# Patient Record
Sex: Male | Born: 1945
Health system: Southern US, Community
[De-identification: ages and names within clinical notes are randomized; demographics above are authoritative.]

## PROBLEM LIST (undated history)

## (undated) DIAGNOSIS — E782 Mixed hyperlipidemia: Secondary | ICD-10-CM

## (undated) DIAGNOSIS — I779 Disorder of arteries and arterioles, unspecified: Secondary | ICD-10-CM

## (undated) DIAGNOSIS — I739 Peripheral vascular disease, unspecified: Secondary | ICD-10-CM

## (undated) DIAGNOSIS — C61 Malignant neoplasm of prostate: Secondary | ICD-10-CM

## (undated) DIAGNOSIS — Z8673 Personal history of transient ischemic attack (TIA), and cerebral infarction without residual deficits: Secondary | ICD-10-CM

## (undated) DIAGNOSIS — I2510 Atherosclerotic heart disease of native coronary artery without angina pectoris: Secondary | ICD-10-CM

## (undated) DIAGNOSIS — K219 Gastro-esophageal reflux disease without esophagitis: Secondary | ICD-10-CM

## (undated) DIAGNOSIS — I639 Cerebral infarction, unspecified: Secondary | ICD-10-CM

## (undated) DIAGNOSIS — C801 Malignant (primary) neoplasm, unspecified: Secondary | ICD-10-CM

## (undated) DIAGNOSIS — I10 Essential (primary) hypertension: Secondary | ICD-10-CM

## (undated) DIAGNOSIS — E119 Type 2 diabetes mellitus without complications: Secondary | ICD-10-CM

## (undated) HISTORY — DX: Mixed hyperlipidemia: E78.2

## (undated) HISTORY — DX: Atherosclerotic heart disease of native coronary artery without angina pectoris: I25.10

## (undated) HISTORY — DX: Disorder of arteries and arterioles, unspecified: I77.9

## (undated) HISTORY — DX: Essential (primary) hypertension: I10

## (undated) HISTORY — DX: Peripheral vascular disease, unspecified: I73.9

## (undated) HISTORY — DX: Personal history of transient ischemic attack (TIA), and cerebral infarction without residual deficits: Z86.73

## (undated) HISTORY — DX: Gastro-esophageal reflux disease without esophagitis: K21.9

## (undated) HISTORY — DX: Type 2 diabetes mellitus without complications: E11.9

## (undated) HISTORY — DX: Malignant neoplasm of prostate: C61

## (undated) HISTORY — PX: TRANSTHORACIC ECHOCARDIOGRAM: SHX275

---

## 2002-07-20 ENCOUNTER — Emergency Department (HOSPITAL_COMMUNITY): Admission: EM | Admit: 2002-07-20 | Discharge: 2002-07-20 | Payer: Self-pay | Admitting: Emergency Medicine

## 2002-07-31 DIAGNOSIS — C61 Malignant neoplasm of prostate: Secondary | ICD-10-CM

## 2002-07-31 HISTORY — DX: Malignant neoplasm of prostate: C61

## 2002-07-31 HISTORY — PX: PROSTATE SURGERY: SHX751

## 2002-10-21 ENCOUNTER — Emergency Department (HOSPITAL_COMMUNITY): Admission: EM | Admit: 2002-10-21 | Discharge: 2002-10-21 | Payer: Self-pay | Admitting: Emergency Medicine

## 2002-10-22 ENCOUNTER — Other Ambulatory Visit: Admission: RE | Admit: 2002-10-22 | Discharge: 2002-10-22 | Payer: Self-pay | Admitting: Urology

## 2002-12-03 ENCOUNTER — Inpatient Hospital Stay (HOSPITAL_COMMUNITY): Admission: RE | Admit: 2002-12-03 | Discharge: 2002-12-08 | Payer: Self-pay | Admitting: Urology

## 2003-02-25 ENCOUNTER — Ambulatory Visit (HOSPITAL_COMMUNITY): Admission: RE | Admit: 2003-02-25 | Discharge: 2003-02-25 | Payer: Self-pay | Admitting: Internal Medicine

## 2003-02-25 HISTORY — PX: COLONOSCOPY: SHX174

## 2003-12-11 ENCOUNTER — Ambulatory Visit (HOSPITAL_COMMUNITY): Admission: RE | Admit: 2003-12-11 | Discharge: 2003-12-11 | Payer: Self-pay | Admitting: Family Medicine

## 2003-12-14 ENCOUNTER — Ambulatory Visit (HOSPITAL_COMMUNITY): Admission: RE | Admit: 2003-12-14 | Discharge: 2003-12-14 | Payer: Self-pay | Admitting: Family Medicine

## 2005-12-19 ENCOUNTER — Ambulatory Visit (HOSPITAL_COMMUNITY): Admission: RE | Admit: 2005-12-19 | Discharge: 2005-12-19 | Payer: Self-pay | Admitting: Internal Medicine

## 2006-02-05 ENCOUNTER — Ambulatory Visit: Payer: Self-pay | Admitting: Internal Medicine

## 2006-02-19 ENCOUNTER — Ambulatory Visit (HOSPITAL_COMMUNITY): Admission: RE | Admit: 2006-02-19 | Discharge: 2006-02-19 | Payer: Self-pay | Admitting: Internal Medicine

## 2006-03-12 ENCOUNTER — Encounter (INDEPENDENT_AMBULATORY_CARE_PROVIDER_SITE_OTHER): Payer: Self-pay | Admitting: *Deleted

## 2006-03-12 ENCOUNTER — Ambulatory Visit: Payer: Self-pay | Admitting: Internal Medicine

## 2006-03-12 ENCOUNTER — Ambulatory Visit (HOSPITAL_COMMUNITY): Admission: RE | Admit: 2006-03-12 | Discharge: 2006-03-12 | Payer: Self-pay | Admitting: Internal Medicine

## 2006-03-12 HISTORY — PX: COLONOSCOPY: SHX174

## 2006-04-16 ENCOUNTER — Ambulatory Visit: Payer: Self-pay | Admitting: Internal Medicine

## 2009-07-31 DIAGNOSIS — Z8673 Personal history of transient ischemic attack (TIA), and cerebral infarction without residual deficits: Secondary | ICD-10-CM

## 2009-07-31 HISTORY — DX: Personal history of transient ischemic attack (TIA), and cerebral infarction without residual deficits: Z86.73

## 2009-07-31 HISTORY — PX: CAROTID ENDARTERECTOMY: SUR193

## 2009-12-09 ENCOUNTER — Inpatient Hospital Stay (HOSPITAL_COMMUNITY): Admission: EM | Admit: 2009-12-09 | Discharge: 2009-12-10 | Payer: Self-pay | Admitting: Emergency Medicine

## 2009-12-09 ENCOUNTER — Encounter (INDEPENDENT_AMBULATORY_CARE_PROVIDER_SITE_OTHER): Payer: Self-pay | Admitting: Emergency Medicine

## 2009-12-09 ENCOUNTER — Ambulatory Visit: Payer: Self-pay | Admitting: Vascular Surgery

## 2009-12-16 ENCOUNTER — Ambulatory Visit: Payer: Self-pay | Admitting: Surgery

## 2009-12-16 ENCOUNTER — Inpatient Hospital Stay (HOSPITAL_COMMUNITY): Admission: RE | Admit: 2009-12-16 | Discharge: 2009-12-17 | Payer: Self-pay | Admitting: Vascular Surgery

## 2009-12-16 ENCOUNTER — Encounter: Payer: Self-pay | Admitting: Vascular Surgery

## 2009-12-29 ENCOUNTER — Ambulatory Visit: Payer: Self-pay | Admitting: Vascular Surgery

## 2010-01-28 ENCOUNTER — Encounter (INDEPENDENT_AMBULATORY_CARE_PROVIDER_SITE_OTHER): Payer: Self-pay

## 2010-01-28 ENCOUNTER — Encounter: Payer: Self-pay | Admitting: Internal Medicine

## 2010-02-08 ENCOUNTER — Emergency Department (HOSPITAL_COMMUNITY): Admission: EM | Admit: 2010-02-08 | Discharge: 2010-02-08 | Payer: Self-pay | Admitting: Emergency Medicine

## 2010-02-14 HISTORY — PX: OTHER SURGICAL HISTORY: SHX169

## 2010-07-14 ENCOUNTER — Ambulatory Visit: Payer: Self-pay | Admitting: Vascular Surgery

## 2010-07-25 ENCOUNTER — Emergency Department (HOSPITAL_COMMUNITY)
Admission: EM | Admit: 2010-07-25 | Discharge: 2010-07-25 | Payer: Self-pay | Source: Home / Self Care | Admitting: Emergency Medicine

## 2010-08-22 ENCOUNTER — Emergency Department (HOSPITAL_COMMUNITY)
Admission: EM | Admit: 2010-08-22 | Discharge: 2010-08-22 | Payer: Self-pay | Source: Home / Self Care | Admitting: Emergency Medicine

## 2010-08-23 LAB — COMPREHENSIVE METABOLIC PANEL
ALT: 13 U/L (ref 0–53)
AST: 14 U/L (ref 0–37)
Albumin: 3.6 g/dL (ref 3.5–5.2)
Alkaline Phosphatase: 66 U/L (ref 39–117)
BUN: 10 mg/dL (ref 6–23)
GFR calc Af Amer: >60 mL/min (ref >60)
Sodium: 136 mEq/L (ref 135–145)
Total Bilirubin: 0.6 mg/dL (ref 0.3–1.2)

## 2010-08-23 LAB — DIFFERENTIAL
Basophils Absolute: 0 10*3/uL (ref 0.0–0.1)
Basophils Relative: 0 % (ref 0–1)
Lymphocytes Relative: 15 % (ref 12–46)
Monocytes Relative: 9 % (ref 3–12)
Neutrophils Relative %: 74 % (ref 43–77)

## 2010-08-23 LAB — CBC
Hemoglobin: 13 g/dL (ref 13.0–17.0)
MCHC: 34.6 g/dL (ref 30.0–36.0)
MCV: 85.8 fL (ref 78.0–100.0)
Platelets: 384 10*3/uL (ref 150–400)
RBC: 4.38 MIL/uL (ref 4.22–5.81)
WBC: 6.7 10*3/uL (ref 4.0–10.5)

## 2010-08-23 LAB — POCT CARDIAC MARKERS
Myoglobin, poc: 95.7 ng/mL (ref 12–200)
Troponin i, poc: <0.05 ng/mL (ref 0.00–0.09)

## 2010-08-30 NOTE — Letter (Signed)
Summary: Appointment Reminder  [facility]  [address]   [place], [place] [address]   Phone: [phone]  Fax: [phone]       January 28, 2010   Jeff [name] [address] [place], [place]  [address] 02/19/1946    Dear Jeff [name],  We have been unable to contact you by phone. Doctor Rourk has  recommended an office visit appointment to further evaluate your  needs. Please call our office @ [phone] to schedule an appointment.   Sincerely,    Cloria Spring LPN  [facility] R. Roetta Sessions, M.D.    Jonette Eva, M.D. Lorenza Burton, FNP-BC    Tana Coast, PA-C Phone: [phone]    Fax: [phone]

## 2010-08-30 NOTE — Letter (Signed)
Summary: Internal Other /need OV  Internal Other /need OV   Imported By: Cloria Spring LPN 16/05/9603 54:09:81  _____________________________________________________________________  External Attachment:    Type:   Image     Comment:   External Document

## 2010-09-07 ENCOUNTER — Ambulatory Visit: Payer: Self-pay | Admitting: Gastroenterology

## 2010-10-10 LAB — CBC
HCT: 38.3 % — ABNORMAL LOW (ref 39.0–52.0)
Hemoglobin: 13 g/dL (ref 13.0–17.0)
MCV: 85.7 fL (ref 78.0–100.0)

## 2010-10-10 LAB — BASIC METABOLIC PANEL
Chloride: 102 mEq/L (ref 96–112)
GFR calc Af Amer: >60 mL/min (ref >60)
GFR calc non Af Amer: 59 mL/min — ABNORMAL LOW (ref >60)
Potassium: 3.6 mEq/L (ref 3.5–5.1)
Sodium: 138 mEq/L (ref 135–145)

## 2010-10-10 LAB — DIFFERENTIAL
Basophils Relative: 0 % (ref 0–1)
Eosinophils Absolute: 0.1 10*3/uL (ref 0.0–0.7)
Lymphocytes Relative: 18 % (ref 12–46)
Lymphs Abs: 1.1 10*3/uL (ref 0.7–4.0)
Neutro Abs: 4.8 10*3/uL (ref 1.7–7.7)
Neutrophils Relative %: 73 % (ref 43–77)

## 2010-10-10 LAB — POCT CARDIAC MARKERS: CKMB, poc: 1.5 ng/mL (ref 1.0–8.0)

## 2010-10-16 LAB — DIFFERENTIAL
Eosinophils Absolute: 0.2 10*3/uL (ref 0.0–0.7)
Eosinophils Relative: 3 % (ref 0–5)
Lymphocytes Relative: 17 % (ref 12–46)
Lymphs Abs: 1 10*3/uL (ref 0.7–4.0)
Monocytes Absolute: 0.4 10*3/uL (ref 0.1–1.0)
Monocytes Relative: 7 % (ref 3–12)
Neutrophils Relative %: 72 % (ref 43–77)

## 2010-10-16 LAB — CBC
MCH: 30 pg (ref 26.0–34.0)
MCHC: 33.6 g/dL (ref 30.0–36.0)
RBC: 4.27 MIL/uL (ref 4.22–5.81)
WBC: 6 10*3/uL (ref 4.0–10.5)

## 2010-10-16 LAB — BASIC METABOLIC PANEL
Chloride: 103 mEq/L (ref 96–112)
Creatinine, Ser: 0.75 mg/dL (ref 0.4–1.5)
GFR calc non Af Amer: >60 mL/min (ref >60)
Potassium: 3.6 mEq/L (ref 3.5–5.1)
Sodium: 138 mEq/L (ref 135–145)

## 2010-10-17 LAB — MRSA PCR SCREENING: MRSA by PCR: NEGATIVE

## 2010-10-17 LAB — ABO/RH: ABO/RH(D): O POS

## 2010-10-17 LAB — CBC
MCV: 90.6 fL (ref 78.0–100.0)
Platelets: 252 10*3/uL (ref 150–400)
WBC: 7.8 10*3/uL (ref 4.0–10.5)

## 2010-10-17 LAB — BASIC METABOLIC PANEL
BUN: 6 mg/dL (ref 6–23)
Calcium: 8.1 mg/dL — ABNORMAL LOW (ref 8.4–10.5)
Creatinine, Ser: 0.76 mg/dL (ref 0.4–1.5)
GFR calc Af Amer: >60 mL/min (ref >60)
GFR calc non Af Amer: >60 mL/min (ref >60)

## 2010-10-17 LAB — TYPE AND SCREEN
ABO/RH(D): O POS
Antibody Screen: NEGATIVE

## 2010-10-18 LAB — LIPID PANEL
Cholesterol: 143 mg/dL (ref 0–200)
HDL: 24 mg/dL — ABNORMAL LOW (ref >39)
LDL Cholesterol: 107 mg/dL — ABNORMAL HIGH (ref 0–99)
Total CHOL/HDL Ratio: 6 RATIO

## 2010-10-18 LAB — CARDIAC PANEL(CRET KIN+CKTOT+MB+TROPI)
CK, MB: 2.1 ng/mL (ref 0.3–4.0)
Total CK: 187 U/L (ref 7–232)
Troponin I: 0.01 ng/mL (ref 0.00–0.06)

## 2010-10-18 LAB — COMPREHENSIVE METABOLIC PANEL
Albumin: 4.1 g/dL (ref 3.5–5.2)
Alkaline Phosphatase: 76 U/L (ref 39–117)
BUN: 14 mg/dL (ref 6–23)
CO2: 27 mEq/L (ref 19–32)
Chloride: 102 mEq/L (ref 96–112)
Potassium: 4.1 mEq/L (ref 3.5–5.1)
Total Bilirubin: 0.8 mg/dL (ref 0.3–1.2)

## 2010-10-18 LAB — URINALYSIS, ROUTINE W REFLEX MICROSCOPIC
Glucose, UA: NEGATIVE mg/dL
Ketones, ur: 15 mg/dL — AB
pH: 5 (ref 5.0–8.0)

## 2010-10-18 LAB — HEMOGLOBIN A1C: Mean Plasma Glucose: 114 mg/dL (ref <117)

## 2010-10-18 LAB — BASIC METABOLIC PANEL
BUN: 17 mg/dL (ref 6–23)
CO2: 29 mEq/L (ref 19–32)
Chloride: 105 mEq/L (ref 96–112)
Creatinine, Ser: 1.11 mg/dL (ref 0.4–1.5)

## 2010-10-18 LAB — POCT CARDIAC MARKERS
Myoglobin, poc: 413 ng/mL (ref 12–200)
Troponin i, poc: <0.05 ng/mL (ref 0.00–0.09)

## 2010-10-18 LAB — URINE MICROSCOPIC-ADD ON

## 2010-10-18 LAB — CBC
HCT: 41.3 % (ref 39.0–52.0)
Hemoglobin: 14.1 g/dL (ref 13.0–17.0)
Platelets: 371 10*3/uL (ref 150–400)
RBC: 4.6 MIL/uL (ref 4.22–5.81)
WBC: 9.1 10*3/uL (ref 4.0–10.5)

## 2010-10-18 LAB — CK TOTAL AND CKMB (NOT AT ARMC): Total CK: 157 U/L (ref 7–232)

## 2010-12-13 NOTE — Assessment & Plan Note (Signed)
OFFICE VISIT   [name], Jeff M  DOB:  02/19/1946                                       12/29/2009  CHART#:[id number]   Please include a copy of the operative note and the hospital discharge  summary for Dr. Phillips Odor.   The patient returns for followup today.  He underwent right carotid  endarterectomy on 12/16/2009 for a symptomatic right internal carotid  artery stenosis with previous stroke.  He had an uneventful  postoperative recovery.  He returns today for further followup.  He does  have some peri incisional numbness but otherwise denies any neurologic  symptoms.   PHYSICAL EXAM:  Blood pressure is 167/65 in the right arm, 151/77 in the  left arm, heart rate is 60 and regular.  Temperature is 98.4.  Right  neck incision is well-healed.  There is no significant drainage.  There  is no drainage.  There is no surrounding erythema.  He does have some  peri incisional numbness.  Tongue is midline.  Upper extremity and lower  extremity motor strength is 5/5 and symmetric.  Facial muscles are  symmetric as well.   The patient is doing well postoperatively.  He will continue to take one  aspirin daily.  I congratulated him today that he has stopped smoking.  He will follow up with [place] in six months' time for a repeat carotid  duplex exam.  Short-term disability paperwork was filled out for the  patient today to return to work 01/05/2010.     Janetta Hora. Fields, MD  Electronically Signed   CEF/MEDQ  D:  12/29/2009  T:  12/30/2009  Job:  [id number]   cc:   [name], M.D.

## 2010-12-13 NOTE — Procedures (Signed)
CAROTID DUPLEX EXAM   INDICATION:  Follow up carotid artery disease.   HISTORY:  Diabetes:  No.  Cardiac:  No.  Hypertension:  Yes.  Smoking:  Previous.  Previous Surgery:  Right carotid endarterectomy, 12/16/2009.  CV History:  History of CVA.  Amaurosis Fugax No, Paresthesias No, Hemiparesis No.                                       RIGHT             LEFT  Brachial systolic pressure:         138               150  Brachial Doppler waveforms:         Normal            Normal  Vertebral direction of flow:        Antegrade/high resistant            Antegrade  DUPLEX VELOCITIES (cm/sec)  CCA peak systolic                   116               135  ECA peak systolic                   98                111  ICA peak systolic                   104               111  ICA end diastolic                   41                37  PLAQUE MORPHOLOGY:                  Intimal wall thickening             Calcific  PLAQUE AMOUNT:                      Mild              Mild  PLAQUE LOCATION:                    CCA               ICA, bifurcation,  ECA   IMPRESSION:  1. Right internal carotid artery suggests 1% to 39% stenosis.  2. Left internal carotid artery suggests 1% to 39% stenosis.  3. Resistive right vertebral artery.   ___________________________________________  Janetta Hora Fields, MD   EM/MEDQ  D:  07/14/2010  T:  07/14/2010  Job:  [id number]

## 2010-12-16 NOTE — Discharge Summary (Signed)
NAME:  Jeff [name], Jeff [name]                          ACCOUNT NO.:  [id number]   MEDICAL RECORD NO.:  [id number]                   PATIENT TYPE:  INP   LOCATION:  A202                                 FACILITY:  [facility]   PHYSICIAN:  Ky Barban, M.D.            DATE OF BIRTH:  02/19/1946   DATE OF ADMISSION:  12/03/2002  DATE OF DISCHARGE:  12/08/2002                                 DISCHARGE SUMMARY   HISTORY:  A 65 year old gentleman who was found to have elevated PSA and  arranged for a prostate biopsy with ultrasound and it came back that he has  prostate cancer with Gleason score of 3.  Patient was advised to undergo a  radical prostatectomy.  Patient was advised about the complications and he  agreed.  He was also advised about alternative treatment of radiotherapy.  He wants to have a radical prostatectomy.   He had pre-admission work up done.  CBC, urinalysis, chem-7 were normal.   He was taken to the operating room on May 5 and underwent bilateral pelvic  node dissection, frozen section, with radical retropubic prostatectomy.  His  frozen sections were negative.  His blood loss during the operation was  about 1000 cc that was replaced with two units of packed cells.   Post-op day #1.  General status is good.  Blood pressure 156/60, pulse 76  per minute, temperature 97.6.  Abdomen is soft and nondistended.  Intake  1500 cc; output 2800 cc.  Sump drain is 150 cc and he was also given another  unit of blood in the recovery room.  His WBC count is 9.9, hematocrit 32%.  Sodium is 133, potassium 3.2, chloride 104, CO2 is 27, BUN 4, creatinine  0.4.  Patient was started on __________  and added KCl to his IV fluids.   Post-op day #2.  He is afebrile  Blood pressure 138/57, temperature 98.7.  Abdomen is soft.  Bowel sounds are present.  Sump is dry.  So I removed the  sump and the urethral catheter.  Started on clear liquids.  Discharged him  from ICU.   Post-op day #3.  He is  afebrile, doing well.  Hematocrit is 35.2.  He is on  a clear liquid diet.  He is passing gas but no BM.  He was  given __________  and he was placed on a regular diet.  He subsequently had  a bowel movement.   Post-op day #4.  He is afebrile and he had a bowel movement so we are going  to discharge him home today.  His wound is healing primarily.   FOLLOW UP:  1. He will come to the office in two days to have the stitches removed.  2. He is going home with a Foley catheter.    DIAGNOSTIC STUDIES:  The final pathology report is back.  His TN M4 is PT2B  NO MH.  His lesion's code is 5.  It involves both lobes capsular extension  is identified.  Ureteral and left side margins are involved.  Seminal  vesicles not involved.  He is doing fine.  He is going home with a Foley  catheter which I will take out in about two weeks.                                               Ky Barban, M.D.    MIJ/MEDQ  D:  12/16/2002  T:  12/16/2002  Job:  [id number]

## 2010-12-16 NOTE — Op Note (Signed)
NAME:  Jeff [name], Jeff [name]                ACCOUNT NO.:  [id number]   MEDICAL RECORD NO.:  [id number]          PATIENT TYPE:  AMB   LOCATION:  DAY                           FACILITY:  [facility]   PHYSICIAN:  R. Roetta Sessions, M.D. DATE OF BIRTH:  02/19/1946   DATE OF PROCEDURE:  03/12/2006  DATE OF DISCHARGE:                                 OPERATIVE REPORT   INDICATIONS FOR PROCEDURE:  The patient is a 65 year old gentleman with a 1  month history of right sided abdominal pain.  The pain is intermittent.  He  has some groin pain as well.  He was strongly hemoccult positive in the  office.  CT scan demonstrated not only ileitis but sigmoid colitis.  He has  a history of prostate cancer in the family.  Denies ever, ever receiving any  radiation therapy to his pelvis or anywhere else for that matter.  Colonoscopy is now being done.  This approach has been discussed with the  patient.  The potential risks, benefits and alternatives have been reviewed.  Please see my handwritten H&P.   PROCEDURE NOTE:  The O2 saturation, blood pressure, pulse and respirations  were monitored throughout the entire procedure.   CONSCIOUS SEDATION:  1. Versed 4 mg IV.  2. Demerol 100 mg IV in divided doses.   INSTRUMENT:  Olympus video chip system.   FINDINGS:  Digital rectal exam revealed no abnormalities.   ENDOSCOPIC FINDINGS:  Prep was adequate.   RECTUM:  Examination of the rectal mucosa revealed no abnormalities.  I was  unable to retroflex the small rectal vault.  For the same reason, I saw the  rectal mucosa __________ well and it appeared normal.   COLON:  The colonic mucosa was surveyed from the retrosigmoid junction to  the left transverse, right colon __________ appendiceal orifice, ileocecal  valve and cecum.  The terminal ileum was intubated 3 cm.  From this level,  the scope was slowly withdrawn.  All previously mentioned mucosal surfaces  were again seen.  The following abnormalities were  noted:  1. Most of the patient's sigmoid colon was inflamed.  There was some loss      of normal vascular pattern, there were small ulcerations and      surrounding erosions throughout the sigmoid.  This tapered off into the      descending colon and the colonic mucosa appeared normal all the way to      the ileocecal valve.  There were a couple of small ulcers in the base      of the cecum.  The ileocecal valve opening was deformed and fish-      mouthed.  I was unable to get a deep intubation into the terminal      ileum; however, I did get the nose of the scope in well enough to see      that the terminal ileum was also inflamed and there were also linear      small elliptical ulcers in the terminal ileum.  Biopsies of the      terminal ileum mucosa and  sigmoid mucosa were taken for histologic      study.  The patient tolerated the procedure well and was reactive to      endoscopy.   IMPRESSION:  1. Normal rectum.  2. Markedly inflamed sigmoid colon with rectal sparing more proximal      colon.  The level of the cecum appeared normal, there were some small      cecal ulcers, markedly abnormal terminal ileum, status post biopsy of      sigmoid colon and terminal ileum mucosa.   The patient may well likely end up with a diagnosis of Crohn's disease;  however, will follow up on path before making further recommendations.  From  February 07, 2006, a CBC reveals an H&H of 12.7 and 40.8, white count 7.2.  Chem  20 looked good except for a slightly elevated glucose of 110.  The amylase  was normal.  Further recommendations to follow.      Jonathon Bellows, M.D.  Electronically Signed     RMR/MEDQ  D:  03/12/2006  T:  03/12/2006  Job:  [id number]   cc:   [name], M.D.  Fax: [phone]

## 2010-12-16 NOTE — Op Note (Signed)
NAME:  Jeff [name], Jeff [name]                          ACCOUNT NO.:  [id number]   MEDICAL RECORD NO.:  [id number]                   PATIENT TYPE:  INP   LOCATION:  IC06                                 FACILITY:  [facility]   PHYSICIAN:  Ky Barban, M.D.            DATE OF BIRTH:  02/19/1946   DATE OF PROCEDURE:  DATE OF DISCHARGE:                                 OPERATIVE REPORT   PREOPERATIVE DIAGNOSIS:  Carcinoma of prostate.   POSTOPERATIVE DIAGNOSIS:  Carcinoma of prostate.   PROCEDURE:  1. Bilateral pelvic node dissection, frozen sections.  2. Radical retropubic prostatectomy.   SURGEON:  Ky Barban, M.D.   ASSISTANT:  Dennie Maizes, M.D.   ANESTHESIA:  Spinal plus general.   COMPLICATIONS:  None.   COUNTS:  Instrument, needle, sponge count correct.   FINDINGS:  Bilateral lymph nodes, external iliac and obturator on both  sides, negative for any metastatic disease.   PROCEDURE:  Patient was given both spinal and general anesthesia, placed in  the semilithotomy position.  After the usual prep and drape, a #20 Foley  catheter inserted into the bladder and a suprapubic incision about 2-1/2  inches Dorner was made.  The rectus sheath was incised and recti separated in  the midline. Retropubic space was entered.  Then, the paracystic area on  both sides was developed.  A self-retaining Turner-Warwick retractor was  applied.  The right external iliac vein was exposed.  Obturator nerve and  vessel were identified on that side, then proceeding to the lymph node  dissection.  Starting in the lateral-most part of the limb on the right  external iliac vein, the fascia around the right external iliac vein is  opened and the tissue is developed between the external iliac vein,  bifurcation of the common iliac and obturator vessel and nerve on this side,  cleaning the psoas fascia, lifting up this lymph node mass with the help of  an Allis clamp, clipping the lymphatics and  dividing them.  The specimen was  removed en bloc and sent for frozen section.  Same thing was done on the  left side.  Both sides report came back negative for any metastatic disease,  so we proceeded to do the prostatectomy.  The endopelvic fascia on both  sides was opened just next to the apex of the prostate and a 2-0 Vicryl  stitch was placed in the dorsal vein complex.  The puboprostatic ligaments,  under vision, were divided.  The apex of the prostate was freed.  Then, a  McDougal clamp was passed behind the dorsal vein complex and it was ligated  with a 0 Vicryl tie and divided in between these ties.  Now, the McDougal  clamp was passed behind the urethra, lifting it up.  Under direct vision,  the apex of the prostate was dissected free from the surrounding stricture.  Urethra was exposed and the  anterior wall of the urethra was divided and it  was tacked to the dorsal vein complex with a running stitch of 3-0 Vicryl,  leaving the stitch attached with the ends on both sides.  The Foley catheter  was grabbed with the help of a Vanderbilt clamp and divided.  The posterior  wall of the urethra was divided under vision.  With slight flexion on the  apex of the prostate with the Foley catheter, the apex of the prostate was  lifted up and the rectum was pushed away, and the urethralis muscle is  divided.  The apex of the prostate was lifted out with traction on the Foley  catheter.  The inferior pedicle of the prostate was divided and ligated.  The Denonvilliers fascia was divided and seminal vesicles were exposed.  The  right vas deferens was clipped and divided and the seminal vesicles were  removed completely.  The left seminal vesicle was quite difficult to get  out; it was fibrosed.  With blunt and sharp dissection, I was able to divide  the vas deferens. The sheath covering the seminal vesicle was divided on  both sides, going laterally towards the front of the prostate.  Now, the   bladder neck was opened in the midline suprapubically after ligating the  superior pedicles on both sides and dividing them.  The bladder neck was  opened up in the midline, the Foley catheter was grabbed and, after  deflating the balloon, it was held between the clamps, the ureteral orifices  were identified, and #5 whistle-tip catheters were passed up into the renal  pelvis.  Both catheters stabilized through the bladder with a 4-0 chromic  stitch.  Now, the posterior bladder neck, under direct vision, was divided.  The remaining part of the sheath covering the seminal vesicle was divided.  The specimen came out along with the right seminal vesicle.  The left  seminal vesicle, I had to go back and dissect it out separately.  The  specimen was removed.  The obturator side was thoroughly irrigated with 300  mL of saline, and the bleeders were coagulated and ligated.  The area was  then left packed.  The bladder neck was then fashioned with closing the  bladder neck, leaving it enough to admit the size of my index finger.  I  used a 2-0 Vicryl stitch to close from the posterior part of the bladder  neck; that is where I put the stitches to close the bladder neck.  The  ureteral catheters taken out through separate stab wound in the bladder.  The bladder neck is epithelialized with a continuous stitch of 4-0 chromic  by everting the bladder mucosa covering the bladder neck.  Now, I am ready  for the anastomosis.  A #20 Foley catheter was inserted into the pelvis  through the urethra.  The pelvic end was held with a free tie to facilitate  in and out of the tip of the catheter from the urethra.  Using three 3-0  Vicryl stitches on the left side, came out through a corresponding area in  the bladder neck, and I used three 2-0 Vicryl stitches on the right side.  A  total of 6 stitches was placed between the bladder neck and the urethra. Once the stitches were in place, I checked the balloon of  the catheter; it  is intact, and it is placed in the bladder and the balloon is inflated.  The  superior blade of  the retractor is removed.  With traction on the Foley  catheter, the bladder is pulled in the retropubic space to approximate the  urethra and the bladder neck.  Then, all the bladder neck stitches were  closed one by one in the same order they were placed.  First stitch at 6  o'clock position was tied to the bladder neck stitch and the last stitch was  tied to the stitch which was in the urethral end.  One end was on each side  so they were tied to one stitch on each side, and also it was tied to the  dorsal vein complex  stitch.  The bladder was irrigated and filled; there  was no leakage.  The retropubic space drained with a Shirley sump which came  out through separate stab wound.  Ureteral catheter came out through the  same hole and Shirley sump and ureteral catheter stabilized to the skin with  a 0 silk stitch.  The patient lost about 1000 mL of blood. He was given two  units of packed cells, remained stable.  The rectus sheath was closed with a  continuous stitch of 0 Vicryl.  Subcutaneous tissue was irrigated, then skin  was closed with staples.  Sterile gauze dressing applied.  The patient left  the operating room in satisfactory condition.                                               Ky Barban, M.D.    MIJ/MEDQ  D:  12/03/2002  T:  12/03/2002  Job:  [id number]

## 2010-12-16 NOTE — Op Note (Signed)
NAME:  Jeff [name], Jeff [name]                          ACCOUNT NO.:  [id number]   MEDICAL RECORD NO.:  [id number]                   PATIENT TYPE:  AMB   LOCATION:  DAY                                  FACILITY:  [facility]   PHYSICIAN:  R. Roetta Sessions, M.D.              DATE OF BIRTH:  02/19/1946   DATE OF PROCEDURE:  02/25/2003  DATE OF DISCHARGE:                                 OPERATIVE REPORT   PROCEDURE:  Colonoscopy with snare polypectomy and biopsy.   ENDOSCOPIST:  Gerrit Friends. Rourk, M.D.   INDICATIONS FOR PROCEDURE:  The patient is a 65 year old gentleman sent over  at the courtesy of Dr. Assunta Found for screening colonoscopy.  He is devoid  of any lower GI tract symptoms.  No family history of colorectal neoplasia.  He has never has his lower GI tract imaged.  Colonoscopy is now being done  as a screening maneuver.  This approach has been discussed with the patient  at length.  Please see my handwritten H&P.   PROCEDURE NOTE:  O2 saturation, blood pressure, pulse and respirations were  monitored throughout the entire procedure.  Conscious sedation: Demerol 100  mg IV and Versed 4 mg IV in divided doses.   INSTRUMENT:  Olympus video chip adult colonoscope.   FINDINGS:  A digital rectal exam revealed no abnormalities.   ENDOSCOPIC FINDINGS:  The prep was adequate.   RECTUM:  Examination of the rectal mucosa including the retroflex view of  the anal verge revealed multiple diminutive polyps. Please see photos.  Otherwise the rectal mucosa appeared normal.   COLON:  The colonic mucosa was surveyed from the rectosigmoid junction  through the left transverse and right colon to the area of the appendiceal  orifice, ileocecal valve, and cecum.  These structures were well seen and  photographed for the record.  The patient has left-sided diverticula around  25-35 cm.  He had focal areas of erythematous raised polypoid mucosa.  There  was a 0.75 cm, angry-appearing, sessile polyp at 35  cm which was resected  and recovered with a snare unit. There were a couple of tiny ulcers  surrounding this area, too, of uncertain significance.  The largest measured  2 mm.  There were smaller diminutive polyps as well that were destroyed with  the snare, and/or cold biopsied.  The remainder of the colonic mucosa to the  cecum appeared normal.   From the level of the cecum and ileocecal valve the scope was slowly  withdrawn.  All previously mentioned mucosal surfaces were again seen; and,  again, there were no other abnormalities were observed.  The patient  tolerated the procedure well and was reacted in endoscopy.   IMPRESSION:  1. Diminutive polyps in the rectum (destroyed with the tip of the snare     unit) not described above.  Otherwise normal rectum.  2. Left-sided diverticula.  3. Sessile polyps  as described above in the sigmoid colon at 25 and 35 sm.     One was snared, the others were biopsied and/or destroyed with the tip of     the snare.  The remainder of the colonic mucosa appeared normal.   RECOMMENDATIONS:  1. No aspirin or arthritis medications for 10 days.  2. Diverticulosis literature.  3. Follow up on path.  4. Further recommendations to follow.                                               Jonathon Bellows, M.D.    RMR/MEDQ  D:  02/25/2003  T:  02/25/2003  Job:  [id number]   cc:   [name], M.D.  [address] [place]. [place]  [place]   [address]  Fax: [phone]

## 2010-12-16 NOTE — H&P (Signed)
NAME:  Jeff [name], Jeff [name]                          ACCOUNT NO.:  [id number]   MEDICAL RECORD NO.:  [id number]                   PATIENT TYPE:  AMB   LOCATION:  DAY                                  FACILITY:  [facility]   PHYSICIAN:  Ky Barban, M.D.            DATE OF BIRTH:  02/19/1946   DATE OF ADMISSION:  12/03/2002  DATE OF DISCHARGE:                                HISTORY & PHYSICAL   CHIEF COMPLAINT:  Prostate cancer.   HISTORY OF PRESENT ILLNESS:  A 65 year old gentleman who was evaluated as an  outpatient in December of 2001 because of his PSA was elevated at 5.2.  At  that time, he underwent prostate biopsy, which was negative, showing some  chronic prostatitis.  The patient did not come back to our practice until  September 08, 2002, when Jeff [name], M.D., his family physician, referred  him to me because his PSA was 4.90, although it was more before that.  I  went ahead and repeated his PSA to get a free PSA.  I found out that within  a couple of weeks his total PSA has gone up to 5.58 and the free PSA was  only 7%.  His father had prostate cancer, so I recommended strongly that we  do another prostate biopsy.  So a prostate biopsy was done with a  transrectal ultrasound on October 22, 2002.  It showed that he prostate  adenocarcinoma, Gleason grade 2 and score of 3.  He underwent double section  biopsies and two pieces were positive.  Because he is only 65 years old and  his father had prostate cancer and died young at the age of 78, I  recommended that he needed to do one of these two things, either have a  radical prostatectomy or radiotherapy.  Both procedures were discussed in  details and I answered his questions.  I recommended that he should undergo  radical prostatectomy.  He understood.  I told the complications, especially  three things:  1.  Urinary incontinence which can be permanent.  2.  Erectile dysfunction which will be permanent.  3.  Need for blood  transfusion.  He came back and told me that he wanted me to go ahead and do  surgery, so he is coming in the morning to undergo radical prostatectomy.   PAST MEDICAL HISTORY:  History of hypertension.  He takes medication for  that.   PAST SURGICAL HISTORY:  He has never had any surgery.   ALLERGIES:  None.   FAMILY HISTORY:  Positive for heart disease and diabetes.  His father died  at the age of 58 with prostate cancer.   REVIEW OF SYSTEMS:  Unremarkable.   PHYSICAL EXAMINATION:  VITAL SIGNS:  The blood pressure is 158/84 and  temperature 98.7 degrees.  CENTRAL NERVOUS SYSTEM:  Negative.  HEENT:  Negative.  NECK:  Negative.  CHEST:  Clear.  HEART:  Regular sinus rhythm.  ABDOMEN:  Soft and flat.  The liver, spleen, and kidneys are not palpable.  BACK:  No CVA tenderness.  EXTERNAL GENITALIA:  Circumcised.  Meatus adequate.  The testicles are  normal.  RECTAL:  Prostate 1.5+, smooth, and firm.  EXTREMITIES:  Normal.   IMPRESSION:  1. Carcinoma of the prostate.  2. Hypertension.   PLAN:  1. Bilateral pelvic node dissection.  2. Frozen section.  3. Radical retropubic prostatectomy.   NOTE:  I have not done any metastatic work-up because his PSA is only 5.58.                                               Ky Barban, M.D.    MIJ/MEDQ  D:  12/02/2002  T:  12/02/2002  Job:  [id number]   cc:   Jeff [name], M.D.  [address] [place]. [place]  [place]  [place] [phone]  Fax: [phone]

## 2010-12-16 NOTE — Procedures (Signed)
NAME:  Jeff [name], Jeff [name]                          ACCOUNT NO.:  [id number]   MEDICAL RECORD NO.:  [id number]                   PATIENT TYPE:  OUT   LOCATION:  RAD                                  FACILITY:  [facility]   PHYSICIAN:  Nicki Guadalajara, M.D.                  DATE OF BIRTH:  02/19/1946   DATE OF PROCEDURE:  DATE OF DISCHARGE:                                  ECHOCARDIOGRAM   INDICATIONS:  This study is performed in this 65 year old gentleman with  increasing shortness of breath and a strong family history for coronary  artery disease.   FINDINGS:  1. Technically this is an adequate M-M0de, 2-dimensional and comprehensive     Doppler echocardiogram.  2. There is concentric left ventricular hypertrophy moderately with septal     thickness measuring 1.7 cm and posterior wall thickness measuring 1.4 cm.  3. Left ventricular end diastolic and end systolic dimensions are normal at     3.8 and 2.5 cm, respectively.  There is normal systolic function with an     estimated ejection fraction of at least 55%.  There is delay in diastolic     relaxation, grade 1.  4. Left atrial dimension was upper normal at 3.6 cm.  5. Right atrium was upper normal in size.  6. Right ventricle was normal.  7. RV function was normal.  8. The aortic root dimension was normal at 3.1 cm.  9. Aortic valve was sclerotic and mildly calcified.  10.      Systolic excursion was excellent to rule out aortic valve stenosis.     There was trace aortic insufficiency.  11.      There was very mild mitral annular calcification.  Mitral valve     leaflets were delicate.  12.      There was trace tricuspid regurgitation.  13.      There were no intracardiac masses, thrombi or effusions.   IMPRESSION:  1. Technically, this is an adequate echocardiogram Doppler study     demonstrating normal left ventricular systolic     function with concentric left ventricular hypertrophy, and evidence for     grade 1 diastolic  dysfunction.  2. There is mild aortic valve sclerosis without stenosis, but a mean     gradient of 6 mm.  3. There is mild mitral annular calcification.      ___________________________________________                                            Nicki Guadalajara, M.D.   TK/MEDQ  D:  12/14/2003  T:  12/15/2003  Job:  [id number]   cc:   [name], M.D.  [address] [place]. [place]  [place]  [place] [address]  Fax: [phone]

## 2011-01-10 ENCOUNTER — Other Ambulatory Visit (INDEPENDENT_AMBULATORY_CARE_PROVIDER_SITE_OTHER): Payer: Medicare Other

## 2011-01-10 DIAGNOSIS — I6529 Occlusion and stenosis of unspecified carotid artery: Secondary | ICD-10-CM

## 2011-01-10 DIAGNOSIS — Z48812 Encounter for surgical aftercare following surgery on the circulatory system: Secondary | ICD-10-CM

## 2011-01-20 NOTE — Procedures (Unsigned)
CAROTID DUPLEX EXAM  INDICATION:  Followup carotid artery disease.  HISTORY: Diabetes:  No. Cardiac:  No. Hypertension:  Yes. Smoking:  Previously. Previous Surgery:  Right carotid endarterectomy 12/16/2009. CV History:  History of CVA. Amaurosis Fugax No, Paresthesias No, Hemiparesis No                                      RIGHT             LEFT Brachial systolic pressure:         135               147 Brachial Doppler waveforms:         Normal            Normal Vertebral direction of flow:        Antegrade/high resistive            Antegrade DUPLEX VELOCITIES (cm/sec) CCA peak systolic                   109               107 ECA peak systolic                   111               123 ICA peak systolic                   102               89 ICA end diastolic                   46                41 PLAQUE MORPHOLOGY:                  Intimal wall thickening             Calcific PLAQUE AMOUNT:                      Mild              Mild PLAQUE LOCATION:                    CCA               ICA, bifurcation, ECA  IMPRESSION: 1. Right ICA velocities suggest 1-39% stenosis. 2. Patent left carotid endarterectomy site with no evidence of     restenosis of the ICA. 3. Resistive right vertebral artery.  ___________________________________________ Janetta Hora Fields, MD  EM/MEDQ  D:  01/11/2011  T:  01/11/2011  Job:  [id number]

## 2011-03-21 IMAGING — US US RENAL
1 series · 14 of 25 positions shown · non-contrast
Comparison: CT 02/19/2006.

CLINICAL DATA: History of acute renal insufficiency. History of
prostatic cancer

RENAL/URINARY TRACT ULTRASOUND COMPLETE

[Series 1: us renal · 0.25mm/px · 14 of 38 slices shown]
[im 1/38]
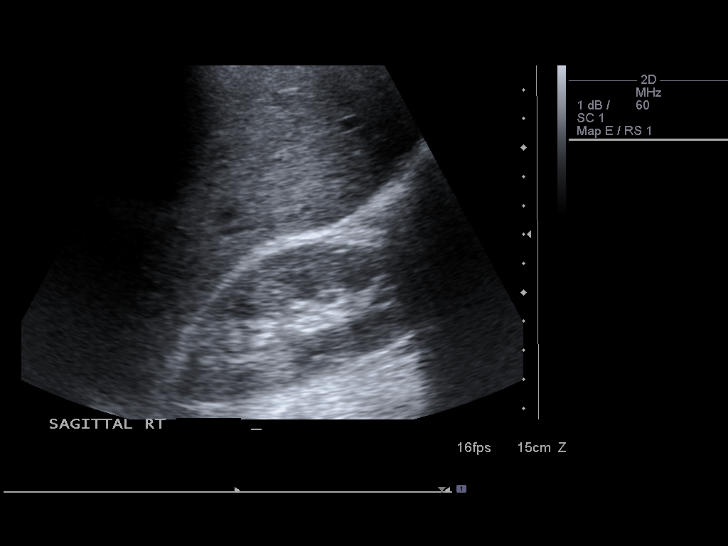
[im 4/38]
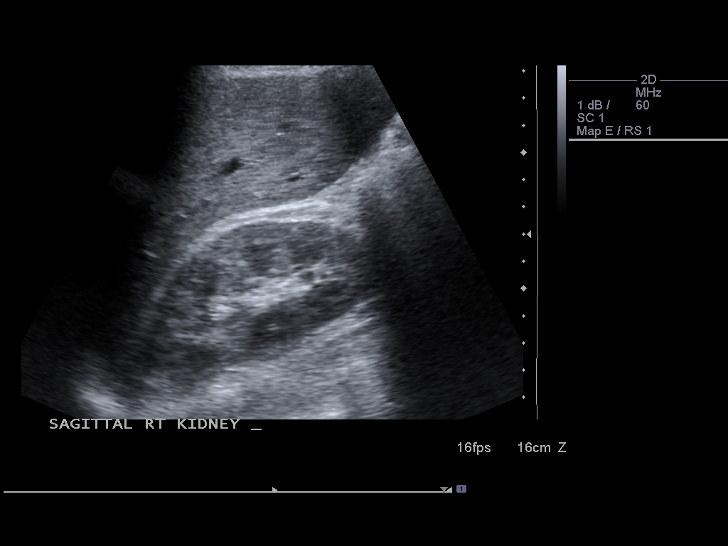
[im 7/38]
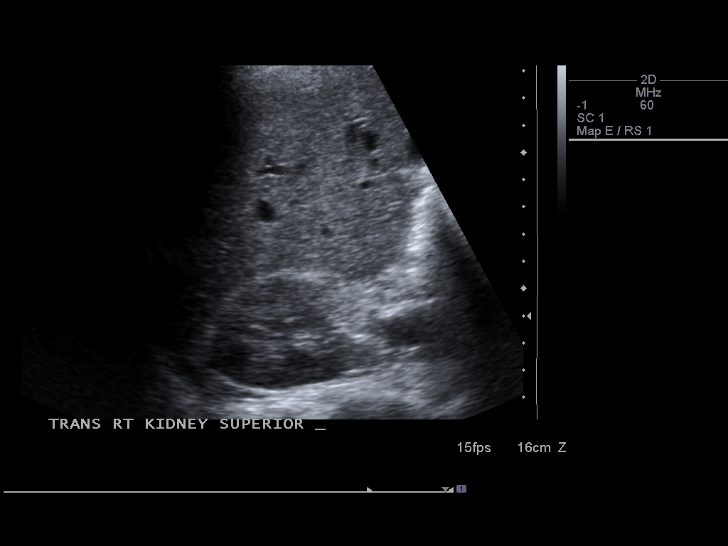
[im 10/38]
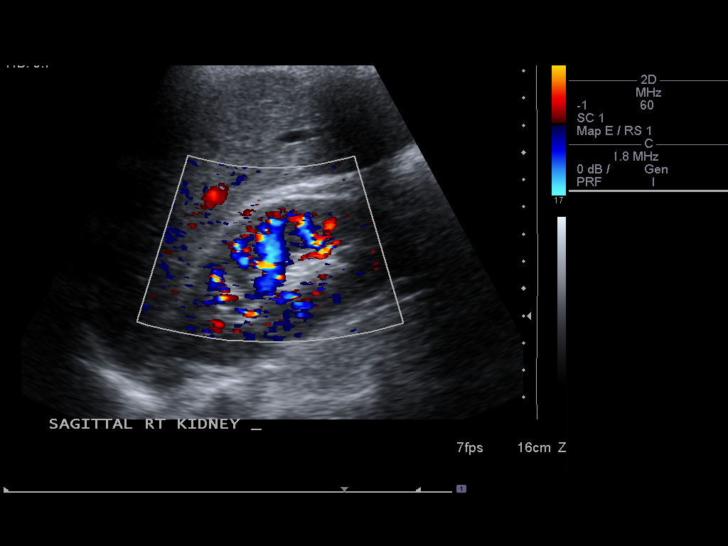
[im 13/38]
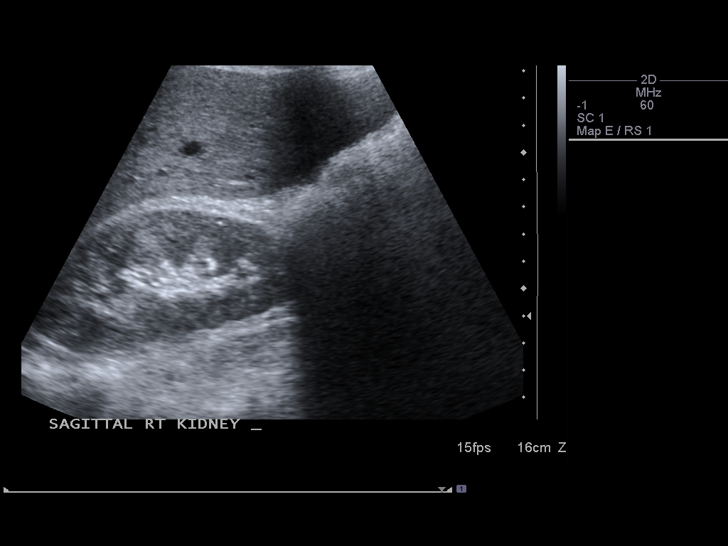
[im 14/38]
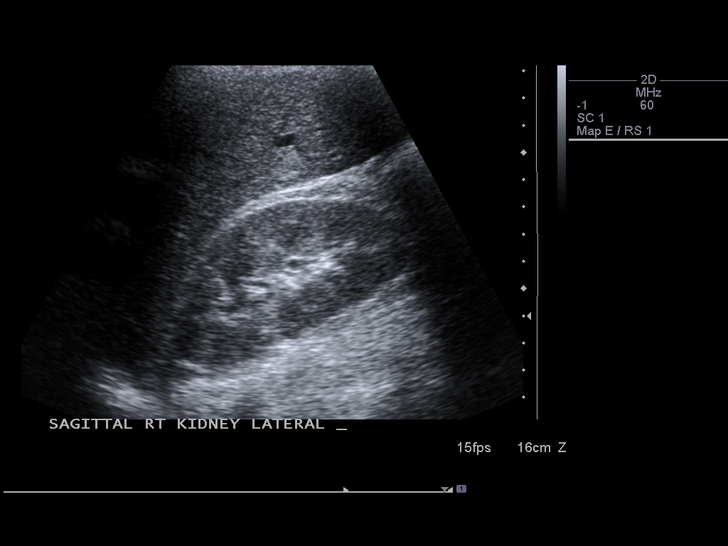
[im 17/38]
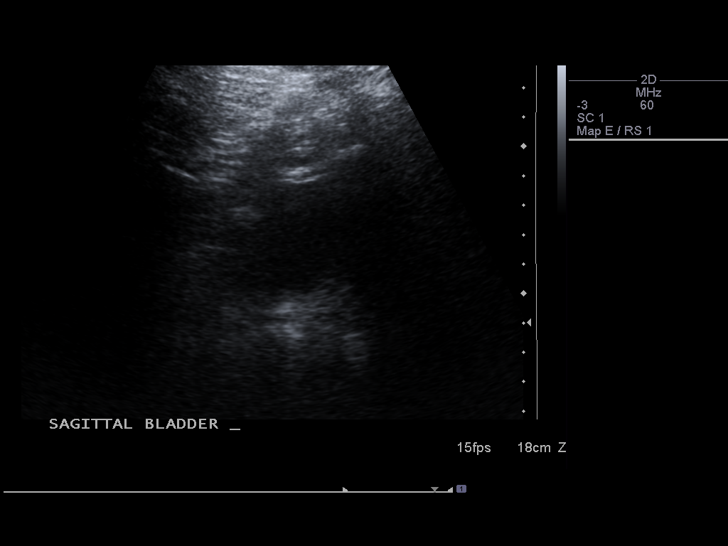
[im 21/38]
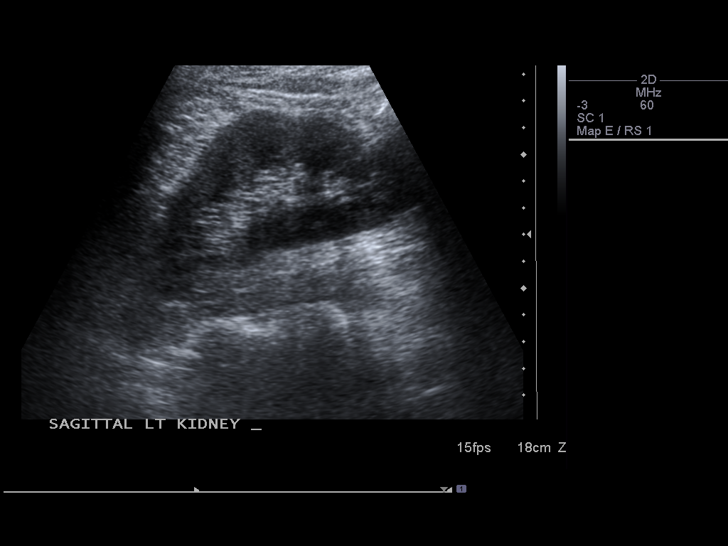
[im 24/38]
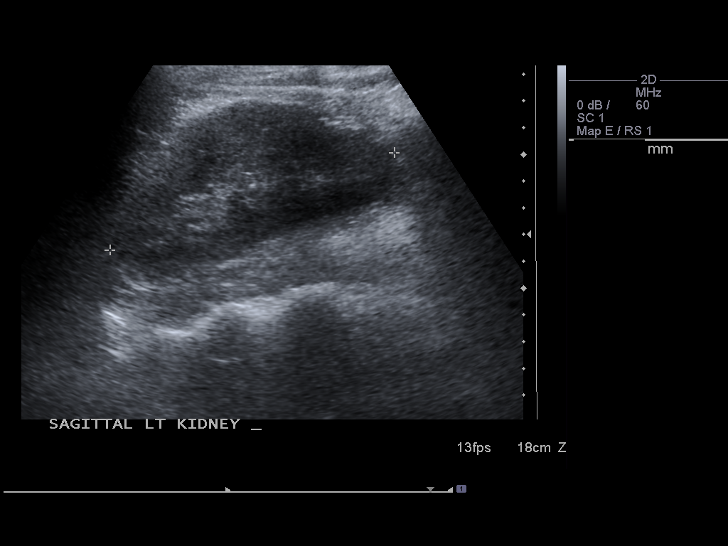
[im 25/38]
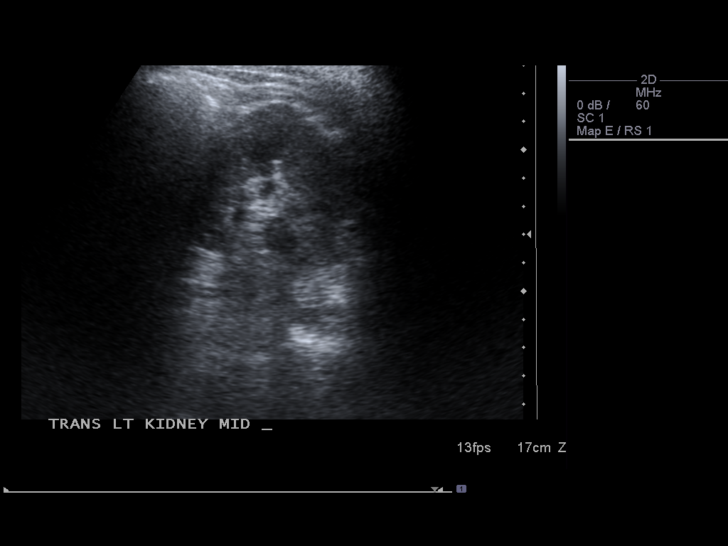
[im 28/38]
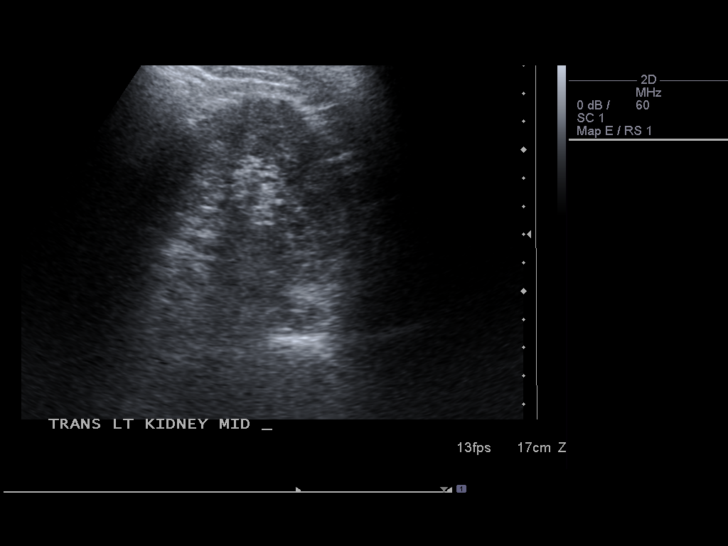
[im 31/38]
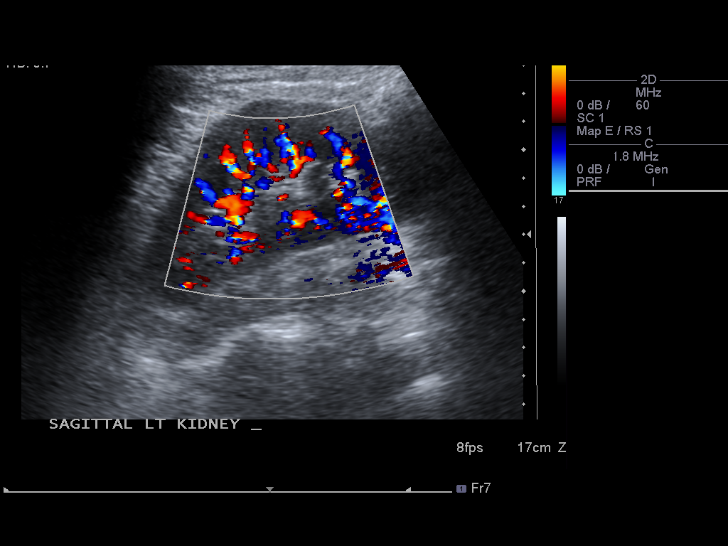
[im 34/38]
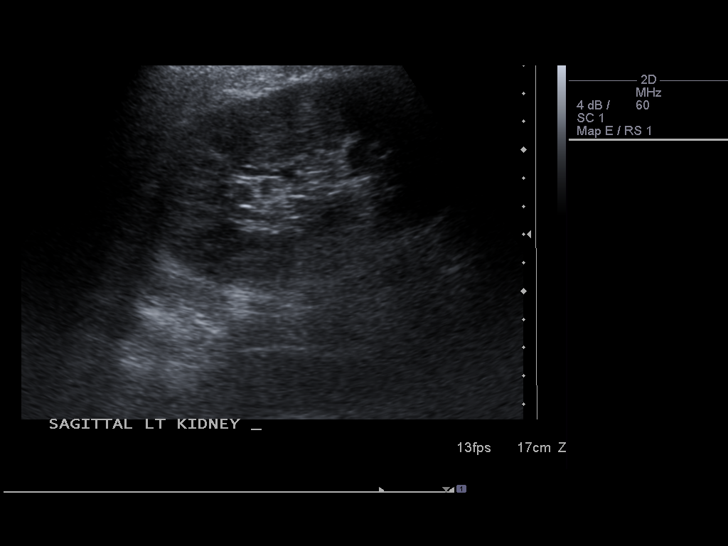
[im 38/38]
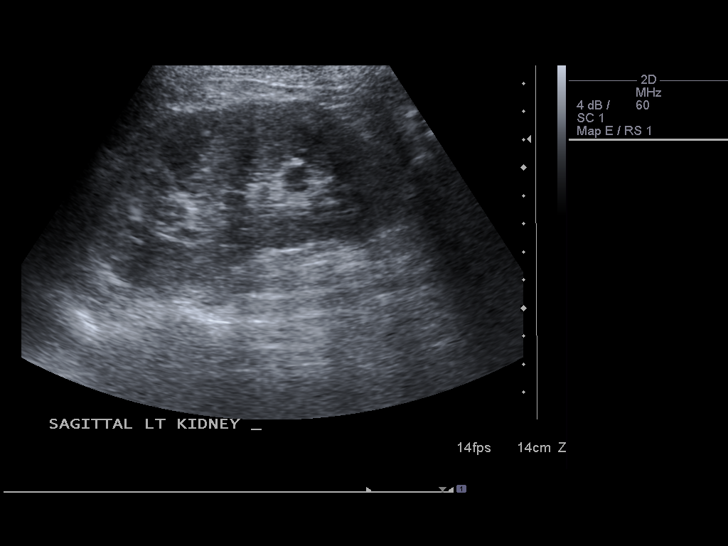

[14 of 25 positions shown; findings below may reference images not displayed]

FINDINGS: Right Kidney:  Right renal length is 10.8 cm.

Left Kidney:  Left renal length is 11.2 cm.

Examination of both kidneys shows no evidence of hydronephrosis,
solid or cystic mass, calculus, parenchymal loss, or parenchymal
texture abnormality.

Bladder:
Bladder is almost empty at time of study.  The patient had voided
prior to the examination. No lesion is evident.
IMPRESSION: No renal abnormality was demonstrated.

## 2011-03-27 IMAGING — CR DG CHEST 2V
3 series · 3 of 3 positions shown · non-contrast
Comparison: 12/19/2005.

CLINICAL DATA: 63-year-old male with ICA stenosis, preoperative
study.

CHEST - 2 VIEW

[view not recorded (1 of 3)]
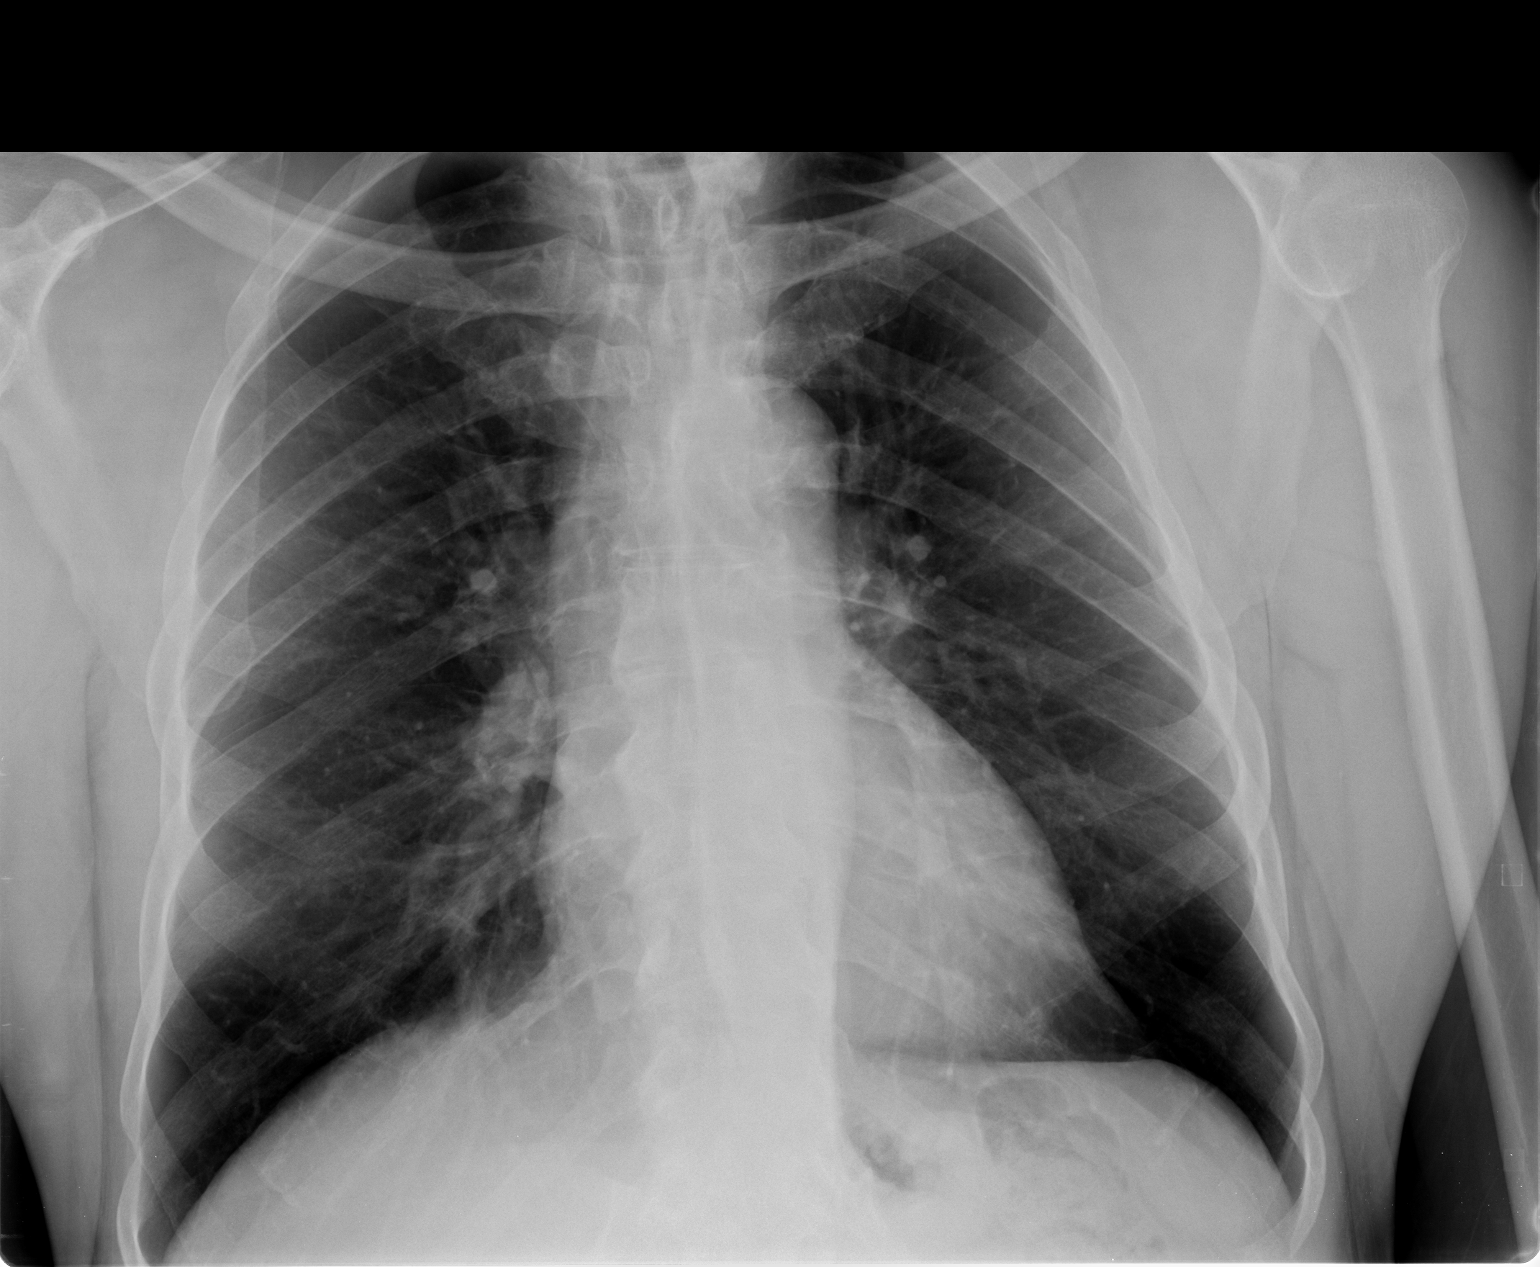

[view not recorded (2 of 3)]
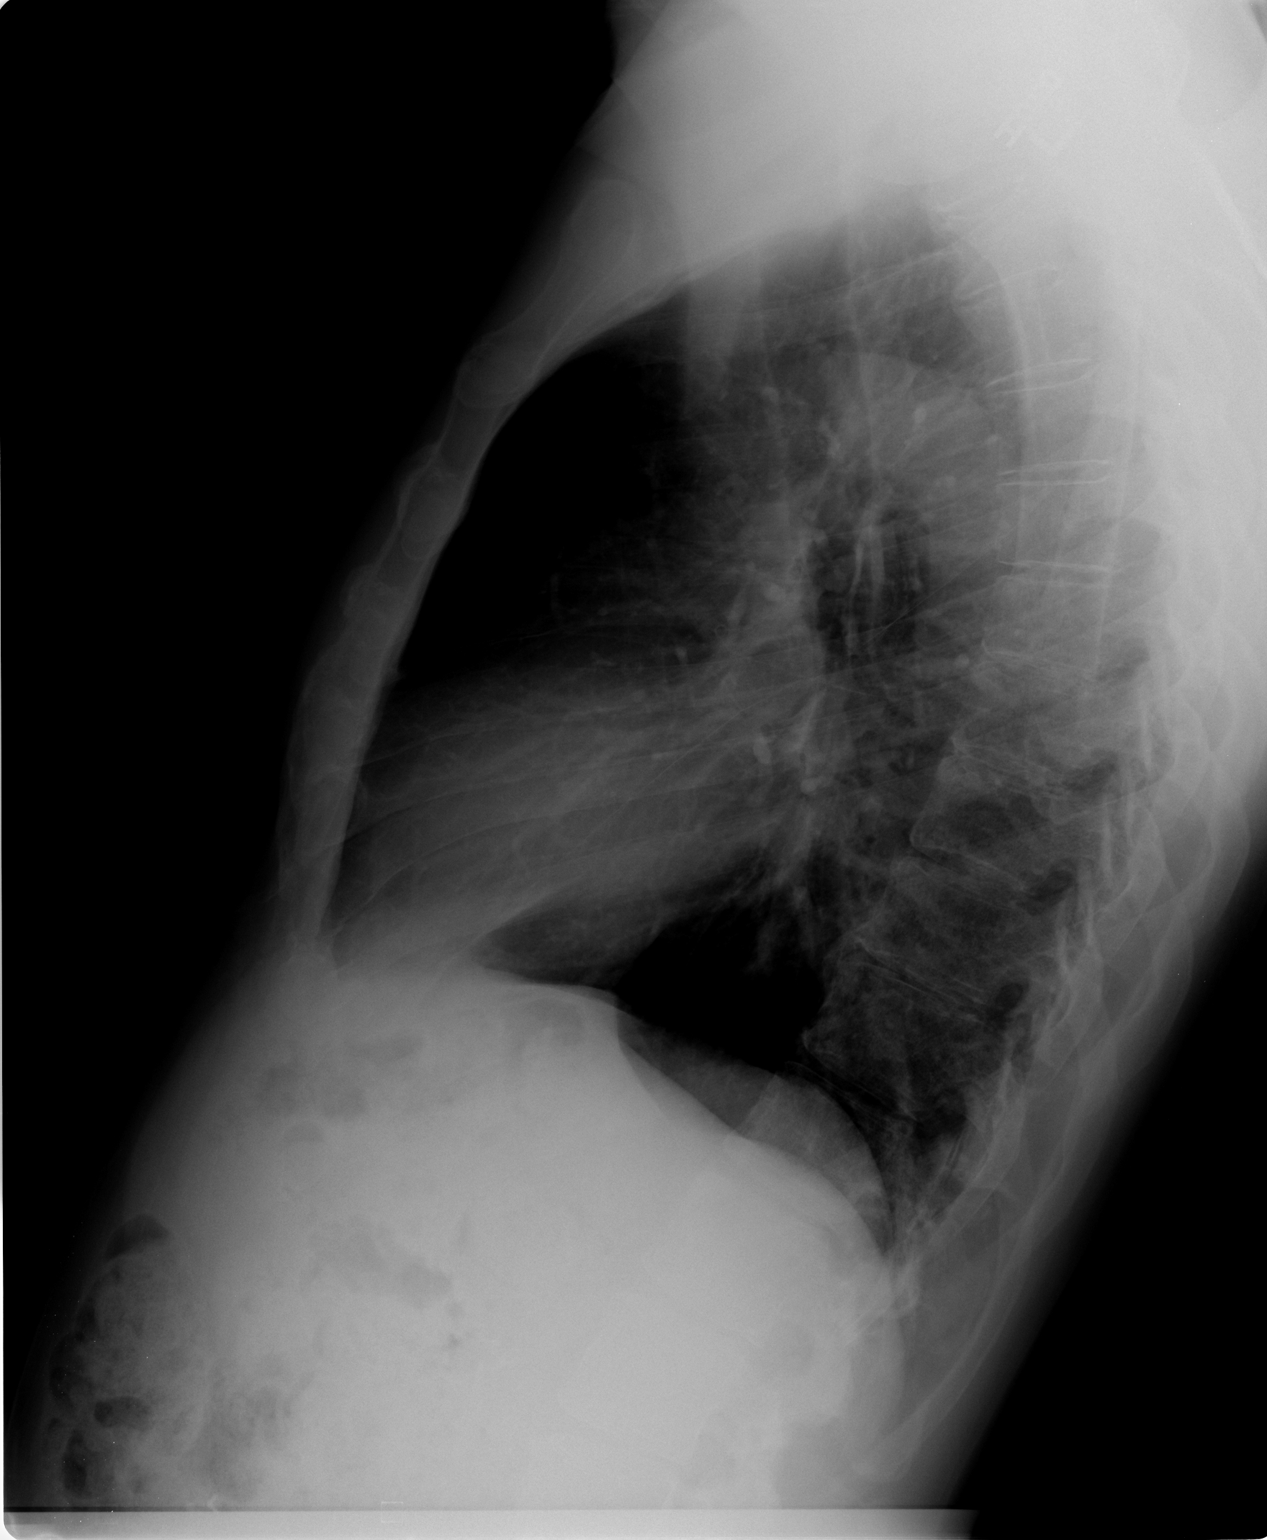

[view not recorded (3 of 3)]
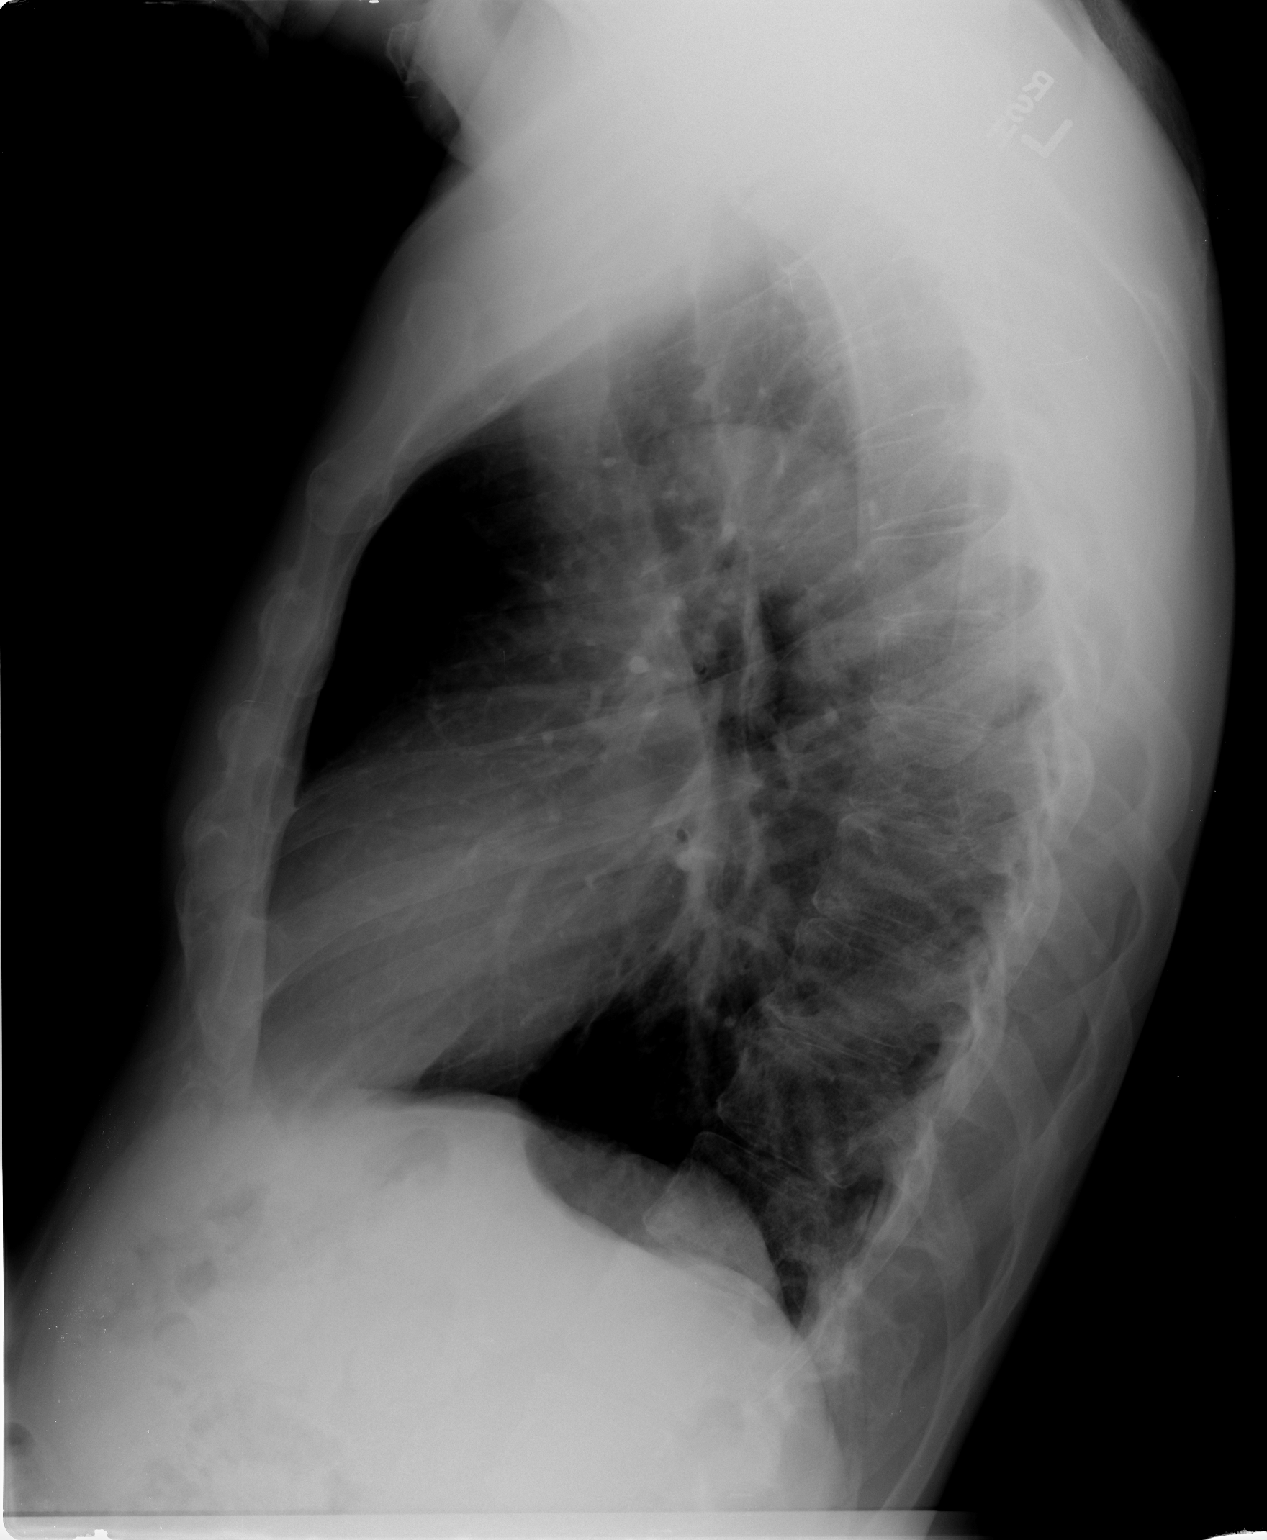

[3 of 3 positions shown; findings below may reference images not displayed]

FINDINGS: Lung volumes at the upper limits of normal, stable.
Cardiac size and mediastinal contours are within normal limits.
Visualized tracheal air column is within normal limits.  The lungs
are stable and clear.  No pneumothorax or effusion. No acute
osseous abnormality identified.
IMPRESSION: No acute cardiopulmonary abnormality.

## 2011-10-20 ENCOUNTER — Encounter (HOSPITAL_COMMUNITY): Payer: Self-pay | Admitting: *Deleted

## 2011-10-20 ENCOUNTER — Emergency Department (HOSPITAL_COMMUNITY)
Admission: EM | Admit: 2011-10-20 | Discharge: 2011-10-20 | Disposition: A | Discharge status: Home / Self Care | Payer: Medicare Other | Attending: Emergency Medicine | Admitting: Emergency Medicine

## 2011-10-20 DIAGNOSIS — Z87891 Personal history of nicotine dependence: Secondary | ICD-10-CM | POA: Insufficient documentation

## 2011-10-20 DIAGNOSIS — I10 Essential (primary) hypertension: Secondary | ICD-10-CM

## 2011-10-20 DIAGNOSIS — Z8673 Personal history of transient ischemic attack (TIA), and cerebral infarction without residual deficits: Secondary | ICD-10-CM | POA: Insufficient documentation

## 2011-10-20 HISTORY — DX: Malignant (primary) neoplasm, unspecified: C80.1

## 2011-10-20 HISTORY — DX: Cerebral infarction, unspecified: I63.9

## 2011-10-20 NOTE — ED Provider Notes (Signed)
History   This chart was scribed for Donnetta Hutching, MD by Charolett Bumpers . The patient was seen in room APA07/APA07 and the patient's care was started at 6:44pm.  CSN: [id number]  Arrival date & time 10/20/11  1701   First MD Initiated Contact with Patient 10/20/11 1834      Chief Complaint  Patient presents with  . Hypertension    (Consider location/radiation/quality/duration/timing/severity/associated sxs/prior treatment) HPI Jeff [name] is a 66 y.o. male who presents to the Emergency Department complaining of constant, moderate hypertension over the past couple days. Patient states that yesterday his BP was 216, and it was 210 this morning. Patient states that his BP is normally 140/70. Patient's BP currently here in the ED is 181/84. Patient states that he has been taking BP medication for the past year. Patient also states that he has been eating foods high in sodium and drinking energy drinks. No symptoms reported.  PCP: Dr. Phillips Odor   Past Medical History  Diagnosis Date  . Hypertension   . Stroke   . Cancer     Past Surgical History  Procedure Date  . Prostate surgery     History reviewed. No pertinent family history.  History  Substance Use Topics  . Smoking status: Former Games developer  . Smokeless tobacco: Not on file  . Alcohol Use: No      Review of Systems A complete 10 system review of systems was obtained and is otherwise negative except as noted in the HPI and PMH.   Allergies  Review of patient's allergies indicates no known allergies.  Home Medications  No current outpatient prescriptions on file.  BP 180/92  Pulse 85  Temp(Src) 98.1 F (36.7 C) (Oral)  Resp 20  Ht 6' (1.829 m)  Wt 185 lb (83.915 kg)  BMI 25.09 kg/m2  SpO2 98%  Physical Exam  Nursing note and vitals reviewed. Constitutional: He is oriented to person, place, and time. He appears well-developed and well-nourished. No distress.  HENT:  Head: Normocephalic and  atraumatic.  Right Ear: External ear normal.  Left Ear: External ear normal.  Nose: Nose normal.  Eyes: EOM are normal. Pupils are equal, round, and reactive to light.  Neck: Normal range of motion. Neck supple. No tracheal deviation present.  Cardiovascular: Normal rate, regular rhythm and normal heart sounds.  Exam reveals no gallop and no friction rub.   No murmur heard. Pulmonary/Chest: Effort normal and breath sounds normal. No respiratory distress. He has no wheezes. He has no rales.  Abdominal: Soft. Bowel sounds are normal. He exhibits no distension. There is no tenderness.  Musculoskeletal: Normal range of motion. He exhibits no edema.  Neurological: He is alert and oriented to person, place, and time. No sensory deficit.  Skin: Skin is warm and dry.  Psychiatric: He has a normal mood and affect. His behavior is normal.    ED Course  Procedures (including critical care time)  DIAGNOSTIC STUDIES: Oxygen Saturation is 98% on room air, normal by my interpretation.    COORDINATION OF CARE:  1850: Discussed how to manage HTN with the patient such as avoiding foods high in sodium and energy drinks. Patient's BP has decreased during his stay in the ED. Will d/c.    Labs Reviewed - No data to display No results found.   No diagnosis found.    MDM  Patient's blood pressure has returned to a more reasonable level. He has no complaints of chest pain or shortness of breath.  Discussed decreasing energy drinks and high sodium foods. Followup with primary care Dr.   I personally performed the services described in this documentation, which was scribed in my presence. The recorded information has been reviewed and considered.       Donnetta Hutching, MD 10/20/11 1932

## 2011-10-20 NOTE — ED Notes (Addendum)
Checked bp and it was elevated. No sx.  Pt thinks it may be an energy drink that he drank today

## 2011-10-20 NOTE — Discharge Instructions (Signed)
Avoid energy drinks and sodium.  There is a lot of sodium and sausage and chicken biscuits.

## 2011-12-27 ENCOUNTER — Telehealth: Payer: Self-pay | Admitting: Vascular Surgery

## 2011-12-27 NOTE — Telephone Encounter (Signed)
Mr. Omary will have his PCP, Dr. Phillips Odor schedule his carotid ultrasound at [facility].   The results will be forwarded to Dr. Darrick Penna for review.     Sure  Leonette Most ----- Message ----- From: Allegra Grana Sent: 12/26/2011 3:04 PM To: Sherren Kerns, MD Subject: Request Carotid to be performed elsewhere   Mr. Scicchitano has requested if his yearly carotid ultrasound can be performed at [facility].   Juliette Alcide

## 2011-12-29 ENCOUNTER — Telehealth: Payer: Self-pay | Admitting: Vascular Surgery

## 2011-12-29 NOTE — Telephone Encounter (Signed)
Pt called requesting that we refer him to [facility] in [place] for a carotid doppler. I informed the patient that since he is an established patient with [place] that our physicians prefer the carotid doppler and follow up appt be done at our vascular lab. Pt wants appt at [facility] due to transportation issues. I suggested to him that he call his PCP Dr.Golding and that possibly he would be able to refer him to [facility] for the carotid doppler. Jacklyn Shell

## 2012-01-01 ENCOUNTER — Other Ambulatory Visit (HOSPITAL_COMMUNITY): Payer: Self-pay | Admitting: Family Medicine

## 2012-01-01 DIAGNOSIS — I63239 Cerebral infarction due to unspecified occlusion or stenosis of unspecified carotid arteries: Secondary | ICD-10-CM

## 2012-01-02 ENCOUNTER — Ambulatory Visit (HOSPITAL_COMMUNITY)
Admission: RE | Admit: 2012-01-02 | Discharge: 2012-01-02 | Disposition: A | Discharge status: Home / Self Care | Payer: Medicare Other | Source: Ambulatory Visit | Attending: Family Medicine | Admitting: Family Medicine

## 2012-01-02 DIAGNOSIS — I63239 Cerebral infarction due to unspecified occlusion or stenosis of unspecified carotid arteries: Secondary | ICD-10-CM | POA: Insufficient documentation

## 2012-01-02 DIAGNOSIS — I10 Essential (primary) hypertension: Secondary | ICD-10-CM | POA: Insufficient documentation

## 2012-01-02 HISTORY — PX: OTHER SURGICAL HISTORY: SHX169

## 2012-01-11 ENCOUNTER — Ambulatory Visit: Payer: Medicare Other | Admitting: Neurosurgery

## 2012-01-11 ENCOUNTER — Other Ambulatory Visit: Payer: Medicare Other

## 2012-03-08 ENCOUNTER — Emergency Department (HOSPITAL_COMMUNITY): Payer: Medicare Other

## 2012-03-08 ENCOUNTER — Other Ambulatory Visit: Payer: Self-pay

## 2012-03-08 ENCOUNTER — Encounter (HOSPITAL_COMMUNITY): Payer: Self-pay | Admitting: Emergency Medicine

## 2012-03-08 ENCOUNTER — Emergency Department (HOSPITAL_COMMUNITY)
Admission: EM | Admit: 2012-03-08 | Discharge: 2012-03-08 | Disposition: A | Discharge status: Home / Self Care | Payer: Medicare Other | Attending: Emergency Medicine | Admitting: Emergency Medicine

## 2012-03-08 DIAGNOSIS — R55 Syncope and collapse: Secondary | ICD-10-CM | POA: Insufficient documentation

## 2012-03-08 DIAGNOSIS — E782 Mixed hyperlipidemia: Secondary | ICD-10-CM | POA: Insufficient documentation

## 2012-03-08 DIAGNOSIS — E7800 Pure hypercholesterolemia, unspecified: Secondary | ICD-10-CM | POA: Insufficient documentation

## 2012-03-08 DIAGNOSIS — Z8673 Personal history of transient ischemic attack (TIA), and cerebral infarction without residual deficits: Secondary | ICD-10-CM | POA: Insufficient documentation

## 2012-03-08 DIAGNOSIS — I10 Essential (primary) hypertension: Secondary | ICD-10-CM | POA: Insufficient documentation

## 2012-03-08 DIAGNOSIS — R209 Unspecified disturbances of skin sensation: Secondary | ICD-10-CM | POA: Insufficient documentation

## 2012-03-08 DIAGNOSIS — Z79899 Other long term (current) drug therapy: Secondary | ICD-10-CM | POA: Insufficient documentation

## 2012-03-08 LAB — CBC WITH DIFFERENTIAL/PLATELET
Basophils Absolute: 0 10*3/uL (ref 0.0–0.1)
HCT: 39.2 % (ref 39.0–52.0)
Lymphs Abs: 1.4 10*3/uL (ref 0.7–4.0)
MCH: 29.2 pg (ref 26.0–34.0)
MCHC: 33.9 g/dL (ref 30.0–36.0)
MCV: 86.2 fL (ref 78.0–100.0)
Monocytes Absolute: 0.7 10*3/uL (ref 0.1–1.0)
Monocytes Relative: 6 % (ref 3–12)
Neutro Abs: 9.5 10*3/uL — ABNORMAL HIGH (ref 1.7–7.7)
Platelets: 229 10*3/uL (ref 150–400)
RDW: 13.9 % (ref 11.5–15.5)
WBC: 11.6 10*3/uL — ABNORMAL HIGH (ref 4.0–10.5)

## 2012-03-08 LAB — BASIC METABOLIC PANEL
BUN: 12 mg/dL (ref 6–23)
Calcium: 9.9 mg/dL (ref 8.4–10.5)
Chloride: 101 mEq/L (ref 96–112)
Creatinine, Ser: 1.94 mg/dL — ABNORMAL HIGH (ref 0.50–1.35)
GFR calc Af Amer: 39 mL/min — ABNORMAL LOW (ref >90)

## 2012-03-08 MED ORDER — SODIUM CHLORIDE 0.9 % IV SOLN: INTRAVENOUS | Status: DC

## 2012-03-08 MED ORDER — SODIUM CHLORIDE 0.9 % IV SOLN
Freq: Once | INTRAVENOUS | Status: AC
  Administered 2012-03-08: 15:00:00 via INTRAVENOUS
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
## 2012-03-08 NOTE — Discharge Instructions (Signed)
Workup in the emergency department without specific findings. Expect to get stiff over the next 2 days. Return for any development of abdominal pain or persistent vomiting between now on 5 days or now. Make an appointment to followup with your regular Dr. Return for any recurrent passing out or any new or worse symptoms.

## 2013-01-24 ENCOUNTER — Other Ambulatory Visit (HOSPITAL_COMMUNITY): Payer: Self-pay | Admitting: Family Medicine

## 2013-01-24 DIAGNOSIS — I63239 Cerebral infarction due to unspecified occlusion or stenosis of unspecified carotid arteries: Secondary | ICD-10-CM

## 2013-01-28 ENCOUNTER — Ambulatory Visit (HOSPITAL_COMMUNITY)
Admission: RE | Admit: 2013-01-28 | Discharge: 2013-01-28 | Disposition: A | Discharge status: Home / Self Care | Payer: Medicare Other | Source: Ambulatory Visit | Attending: Family Medicine | Admitting: Family Medicine

## 2013-01-28 DIAGNOSIS — I6529 Occlusion and stenosis of unspecified carotid artery: Secondary | ICD-10-CM | POA: Insufficient documentation

## 2013-01-28 DIAGNOSIS — I658 Occlusion and stenosis of other precerebral arteries: Secondary | ICD-10-CM | POA: Insufficient documentation

## 2013-01-28 DIAGNOSIS — I63239 Cerebral infarction due to unspecified occlusion or stenosis of unspecified carotid arteries: Secondary | ICD-10-CM

## 2013-01-28 DIAGNOSIS — I10 Essential (primary) hypertension: Secondary | ICD-10-CM | POA: Insufficient documentation

## 2013-01-28 DIAGNOSIS — Z87891 Personal history of nicotine dependence: Secondary | ICD-10-CM | POA: Insufficient documentation

## 2013-05-05 ENCOUNTER — Ambulatory Visit: Payer: Medicare Other | Admitting: Gastroenterology

## 2013-05-12 ENCOUNTER — Ambulatory Visit (INDEPENDENT_AMBULATORY_CARE_PROVIDER_SITE_OTHER): Payer: Medicare Other | Admitting: Gastroenterology

## 2013-05-12 ENCOUNTER — Encounter (INDEPENDENT_AMBULATORY_CARE_PROVIDER_SITE_OTHER): Payer: Self-pay

## 2013-05-12 ENCOUNTER — Encounter: Payer: Self-pay | Admitting: Gastroenterology

## 2013-05-12 VITALS — BP 129/71 | HR 82 | Temp 97.6°F | Ht 71.0 in | Wt 187.6 lb

## 2013-05-12 DIAGNOSIS — Z860100 Personal history of colon polyps, unspecified: Secondary | ICD-10-CM | POA: Insufficient documentation

## 2013-05-12 MED ORDER — PEG 3350-KCL-NA BICARB-NACL 420 G PO SOLR: 4000.0000 mL | ORAL | Status: DC

## 2013-05-12 NOTE — Progress Notes (Signed)
Primary Care Physician:  Colette Ribas, MD  Primary Gastroenterologist:  Roetta Sessions, MD   Chief Complaint  Patient presents with  . Colonoscopy    HPI:  Jeff [name] is a 67 y.o. male here to schedule surveillance colonoscopy per recall list. Last colonoscopy was in August 2007. At that time he had a markedly inflamed sigmoid colon with rectal sparing. More normal appearing colon towards the ileocecal valve except for a few small cecal base ulcers. Ileocecal valve opening appeared abnormal. Couple of elliptical ulcers of the terminal ileum. Biopsy showed focal active colitis that somewhat nonspecific. I do not have his old chart for review. 2004 colonoscopy showed multiple polyps, path report has been requested.   No blood in stool, melena, abdominal pain. No constipation, diarrhea. No heartburn, dysphagia, vomiting. No weight loss.   Current Outpatient Prescriptions  Medication Sig Dispense Refill  . aspirin EC 325 MG tablet Take 325 mg by mouth every morning.       Marland Kitchen b complex-C-folic acid 1 MG capsule Take 1 capsule by mouth daily.      Marland Kitchen losartan (COZAAR) 100 MG tablet Take 50 mg by mouth daily.       . metoprolol succinate (TOPROL-XL) 25 MG 24 hr tablet Take 25 mg by mouth daily.      . montelukast (SINGULAIR) 10 MG tablet Take 10 mg by mouth at bedtime.      . simvastatin (ZOCOR) 40 MG tablet Take 40 mg by mouth every evening.      . triamterene-hydrochlorothiazide (DYAZIDE) 37.5-25 MG per capsule Take 1 capsule by mouth every other day.       No current facility-administered medications for this visit.    Allergies as of 05/12/2013  . (No Known Allergies)    Past Medical History  Diagnosis Date  . Hypertension   . Stroke 2011  . Cancer 2004    prostate cancer  . High cholesterol     Past Surgical History  Procedure Laterality Date  . Prostate surgery  2004    prostate cancer  . Colonoscopy  03/12/2006    RMR: Normal rectum/ Markedly inflamed sigmoid  colon with rectal sparing more proximal  colon.  The level of the cecum appeared normal, there were some small   cecal ulcers, markedly abnormal terminal ileum, status post biopsy of sigmoid colon and terminal ileum mucosa. Biopsy showed active colitis, nonspecific there  . Colonoscopy   02/25/2003    [id number] polyps in the rectum (destroyed with the tip of the snare unit) not described above.  Otherwise normal rectum/. Left-sided diverticula/Sessile polyps as described above in the sigmoid colon at 25 and 35 sm.  . Carotid endarterectomy  2011    Right    Family History  Problem Relation Age of Onset  . Colon cancer Neg Hx     History   Social History  . Marital Status: Single    Spouse Name: N/A    Number of Children: N/A  . Years of Education: N/A   Occupational History  . Not on file.   Social History Main Topics  . Smoking status: Former Games developer  . Smokeless tobacco: Not on file  . Alcohol Use: No  . Drug Use: No  . Sexual Activity:    Other Topics Concern  . Not on file   Social History Narrative  . No narrative on file      ROS:  General: Negative for anorexia, weight loss, fever, chills, fatigue, weakness. Eyes:  Negative for vision changes.  ENT: Negative for hoarseness, difficulty swallowing , nasal congestion. CV: Negative for chest pain, angina, palpitations, dyspnea on exertion, peripheral edema.  Respiratory: Negative for dyspnea at rest, dyspnea on exertion, cough, sputum, wheezing.  GI: See history of present illness. GU:  Negative for dysuria, hematuria, urinary incontinence, urinary frequency, nocturnal urination.  MS: Negative for joint pain, low back pain.  Derm: Negative for rash or itching.  Neuro: Negative for weakness, abnormal sensation, seizure, frequent headaches, memory loss, confusion.  Psych: Negative for anxiety, depression, suicidal ideation, hallucinations.  Endo: Negative for unusual weight change.  Heme: Negative for bruising  or bleeding. Allergy: Negative for rash or hives.    Physical Examination:  BP 129/71  Pulse 82  Temp(Src) 97.6 F (36.4 C) (Oral)  Ht 5\' 11"  (1.803 m)  Wt 187 lb 9.6 oz (85.095 kg)  BMI 26.18 kg/m2   General: Well-nourished, well-developed in no acute distress.  Head: Normocephalic, atraumatic.   Eyes: Conjunctiva pink, no icterus. Mouth: Oropharyngeal mucosa moist and pink , no lesions erythema or exudate. Neck: Supple without thyromegaly, masses, or lymphadenopathy.  Lungs: Clear to auscultation bilaterally.  Heart: Regular rate and rhythm, no murmurs rubs or gallops.  Abdomen: Bowel sounds are normal, nontender, nondistended, no hepatosplenomegaly or masses, no abdominal bruits or    hernia , no rebound or guarding.   Rectal: not performed Extremities: No lower extremity edema. No clubbing or deformities.  Neuro: Alert and oriented x 4 , grossly normal neurologically.  Skin: Warm and dry, no rash or jaundice.   Psych: Alert and cooperative, normal mood and affect.

## 2013-05-12 NOTE — Progress Notes (Signed)
cc'd to pcp 

## 2013-05-12 NOTE — Assessment & Plan Note (Signed)
67 y/o male with h/o colon polyps in 2004 and abnormal sigmoid colon/TI in 2007 as described above. Presents for surveillance colonoscopy. Completely asymptomatic from GI standpoint. Colonoscopy in the near future.  I have discussed the risks, alternatives, benefits with regards to but not limited to the risk of reaction to medication, bleeding, infection, perforation and the patient is agreeable to proceed. Written consent to be obtained.

## 2013-05-12 NOTE — Patient Instructions (Signed)
1. We have scheduled you for a colonoscopy with Dr. Rourk. Please see separate instructions. 

## 2013-05-14 ENCOUNTER — Encounter (HOSPITAL_COMMUNITY): Payer: Self-pay | Admitting: Pharmacy Technician

## 2013-05-22 ENCOUNTER — Encounter: Payer: Self-pay | Admitting: Gastroenterology

## 2013-05-26 ENCOUNTER — Ambulatory Visit (HOSPITAL_COMMUNITY)
Admission: RE | Admit: 2013-05-26 | Discharge: 2013-05-26 | Disposition: A | Discharge status: Home / Self Care | Payer: Medicare Other | Source: Ambulatory Visit | Attending: Internal Medicine | Admitting: Internal Medicine

## 2013-05-26 ENCOUNTER — Encounter (HOSPITAL_COMMUNITY)
Admission: RE | Disposition: A | Discharge status: Home / Self Care | Payer: Self-pay | Source: Ambulatory Visit | Attending: Internal Medicine

## 2013-05-26 ENCOUNTER — Telehealth: Payer: Self-pay | Admitting: General Practice

## 2013-05-26 DIAGNOSIS — Z860100 Personal history of colon polyps, unspecified: Secondary | ICD-10-CM | POA: Insufficient documentation

## 2013-05-26 DIAGNOSIS — Z01818 Encounter for other preprocedural examination: Secondary | ICD-10-CM | POA: Insufficient documentation

## 2013-05-26 DIAGNOSIS — Z09 Encounter for follow-up examination after completed treatment for conditions other than malignant neoplasm: Secondary | ICD-10-CM | POA: Insufficient documentation

## 2013-05-26 SURGERY — COLONOSCOPY
Anesthesia: Moderate Sedation

## 2013-05-26 MED ORDER — SODIUM CHLORIDE 0.9 % IV SOLN: INTRAVENOUS | Status: DC

## 2013-05-26 NOTE — OR Nursing (Signed)
Patient arrived for a colonoscopy. Asked patient who was driving him home and he said, "I'm calling a cab." Explained to the patient that he must have somebody here with him to drive him home and to sign his discharge instructions stating that they accept responsibility for his care. Patient stated that he did not have anybody that could drive him home. Patient stated, "nobody told me that I needed somebody to sign my papers." I showed him the pre-op instructions that were given to him by the office which state that there must be an adult to drive him home and remain at the hospital during the entire procedure. Patient admitted to reading that and stated, "but that doesn't say I need somebody to sign my papers. " I explained to the patient that he did not have an adult in the waiting room and that this was required for his safety. Patient stormed out of the department. Dr. Jena Gauss and office notified.

## 2013-05-26 NOTE — Telephone Encounter (Signed)
I personally told Jeff [name] he needed to have transportation as I do every patient because of being sedated. I also went over the instruction sheet with him, that he took home with him which states he needs someone 32 yrs of age or older to provide transportation for him the day of the procedure.

## 2013-05-26 NOTE — Telephone Encounter (Signed)
I tried to call the patient to speak with him and he would not let me speak and stated "just forget it" and disconnected the call.

## 2013-05-26 NOTE — Telephone Encounter (Signed)
Noted  

## 2013-05-26 NOTE — Telephone Encounter (Signed)
I received a call from [facility] in Endo and stated the patient was upset because he was not told he needed to have someone drive him home after the procedure.  Shawna Orleans stated she pulled up his instructions and showed him the statement:  You will need a responsible adult at least 68 years of age to accompany you and drive you home. This person must remain in the waiting room during your procedure.  The patient then left angry and said he was not having the procedure.

## 2013-09-04 ENCOUNTER — Ambulatory Visit (INDEPENDENT_AMBULATORY_CARE_PROVIDER_SITE_OTHER): Payer: Medicare HMO | Admitting: Otolaryngology

## 2013-09-04 DIAGNOSIS — H9319 Tinnitus, unspecified ear: Secondary | ICD-10-CM

## 2013-09-04 DIAGNOSIS — H905 Unspecified sensorineural hearing loss: Secondary | ICD-10-CM

## 2013-09-04 DIAGNOSIS — H903 Sensorineural hearing loss, bilateral: Secondary | ICD-10-CM

## 2014-01-05 ENCOUNTER — Other Ambulatory Visit (HOSPITAL_COMMUNITY): Payer: Self-pay | Admitting: Family Medicine

## 2014-01-05 DIAGNOSIS — Z139 Encounter for screening, unspecified: Secondary | ICD-10-CM

## 2014-01-05 DIAGNOSIS — I6529 Occlusion and stenosis of unspecified carotid artery: Secondary | ICD-10-CM

## 2014-01-05 DIAGNOSIS — E785 Hyperlipidemia, unspecified: Secondary | ICD-10-CM

## 2014-01-05 DIAGNOSIS — I10 Essential (primary) hypertension: Secondary | ICD-10-CM

## 2014-01-08 ENCOUNTER — Ambulatory Visit (HOSPITAL_COMMUNITY)
Admission: RE | Admit: 2014-01-08 | Discharge: 2014-01-08 | Disposition: A | Discharge status: Home / Self Care | Payer: Medicare HMO | Source: Ambulatory Visit | Attending: Family Medicine | Admitting: Family Medicine

## 2014-01-08 DIAGNOSIS — I6529 Occlusion and stenosis of unspecified carotid artery: Secondary | ICD-10-CM

## 2014-01-08 DIAGNOSIS — I10 Essential (primary) hypertension: Secondary | ICD-10-CM

## 2014-01-08 DIAGNOSIS — Z0389 Encounter for observation for other suspected diseases and conditions ruled out: Secondary | ICD-10-CM | POA: Insufficient documentation

## 2014-01-08 DIAGNOSIS — E785 Hyperlipidemia, unspecified: Secondary | ICD-10-CM

## 2014-01-08 DIAGNOSIS — Z139 Encounter for screening, unspecified: Secondary | ICD-10-CM

## 2014-07-22 ENCOUNTER — Other Ambulatory Visit: Payer: Self-pay

## 2014-08-10 ENCOUNTER — Encounter: Payer: Self-pay | Admitting: Cardiology

## 2014-08-10 ENCOUNTER — Ambulatory Visit (INDEPENDENT_AMBULATORY_CARE_PROVIDER_SITE_OTHER): Payer: Medicare HMO | Admitting: Cardiology

## 2014-08-10 VITALS — BP 132/84 | HR 85 | Ht 71.0 in | Wt 196.0 lb

## 2014-08-10 DIAGNOSIS — E782 Mixed hyperlipidemia: Secondary | ICD-10-CM

## 2014-08-10 DIAGNOSIS — I6529 Occlusion and stenosis of unspecified carotid artery: Secondary | ICD-10-CM | POA: Insufficient documentation

## 2014-08-10 DIAGNOSIS — I10 Essential (primary) hypertension: Secondary | ICD-10-CM | POA: Insufficient documentation

## 2014-08-10 DIAGNOSIS — Z8249 Family history of ischemic heart disease and other diseases of the circulatory system: Secondary | ICD-10-CM | POA: Insufficient documentation

## 2014-08-10 DIAGNOSIS — I6523 Occlusion and stenosis of bilateral carotid arteries: Secondary | ICD-10-CM

## 2014-08-10 NOTE — Assessment & Plan Note (Signed)
Less than 50% bilateral ICA stenoses by recent carotid Dopplers, status post prior right CEA.  He continues on aspirin and statin.

## 2014-08-10 NOTE — Assessment & Plan Note (Signed)
Regular exercise plan. Also on Dyazide and Cozaar.

## 2014-08-10 NOTE — Patient Instructions (Signed)
Your physician recommends that you schedule a follow-up appointment in: we will call you with test results     Your physician has requested that you have a stress echocardiogram. For further information please visit [email or web address]. Please follow instruction sheet as given.   PLEASE HOLE YOUR TOPROL THE MORNING OF TEST     Thank you for choosing [facility] !

## 2014-08-10 NOTE — Assessment & Plan Note (Addendum)
Two siblings as noted above with early heart disease. He already has personal history of atherosclerosis in the form of carotid artery disease and prior stroke in 2011, also hypertension and hyperlipidemia. Fortunately, he has not manifested any obvious angina symptoms, and seems to have reasonable functional status based on description of his work and exercise regimen. ECG is normal at baseline. We will further risk stratify with exercise echocardiogram (hold Toprol-XL for study), if this looks good, would continue  current efforts at medical therapy, diet, and exercise. If abnormal, we can discuss further.

## 2014-08-10 NOTE — Assessment & Plan Note (Signed)
Good cholesterol control on Zocor, recent LDL 58.

## 2014-08-10 NOTE — Progress Notes (Signed)
Reason for visit:  Cardiac evaluation  Clinical Summary Jeff [name] is a 69 y.o.male referred for cardiology consultation by Dr. Hilma Favors.  He has a history of carotid artery disease with previous stroke back in 2011,  hyperlipidemia and hypertension. Family history is also significant for CAD including a brother in his 14s and sister in her 60s. He underwent stress testing several years ago, has not had any follow-up ischemic evaluations. From a symptom perspective he does well, no angina, typically NYHA class 1-2 dyspnea. He works 4 hours, 5 days a week for the [employer] in [place]. Also walks on the treadmill for about an hour one or 2 days a week at the [facility].  ECG today shows normal sinus rhythm.  Carotid Dopplers from June 2015 reported less than 50% bilateral ICA stenoses. Abdominal ultrasound from June 2015 reported no evidence of AAA with proximal abdominal aorta measuring 2.7 cm.  Lab work in June 2015 showed total cholesterol 103, HDL 30, LDL 58, triglycerides 76, creatinine 0.9, BUN 16.   No Known Allergies  Current Outpatient Prescriptions  Medication Sig Dispense Refill  . aspirin EC 325 MG tablet Take 325 mg by mouth every morning.     Marland Kitchen b complex-C-folic acid 1 MG capsule Take 1 capsule by mouth daily.    Marland Kitchen losartan (COZAAR) 100 MG tablet Take 50 mg by mouth daily.     . metoprolol succinate (TOPROL-XL) 25 MG 24 hr tablet Take 25 mg by mouth daily.    . montelukast (SINGULAIR) 10 MG tablet Take 10 mg by mouth at bedtime.    . polyethylene glycol-electrolytes (TRILYTE) 420 G solution Take 4,000 mLs by mouth as directed. 4000 mL 0  . simvastatin (ZOCOR) 40 MG tablet Take 40 mg by mouth every evening.    . triamterene-hydrochlorothiazide (DYAZIDE) 37.5-25 MG per capsule Take 1 capsule by mouth every other day.     No current facility-administered medications for this visit.    Past Medical History  Diagnosis Date  . Essential hypertension   . History of stroke  2011  . Prostate cancer 2004  . Mixed hyperlipidemia   . Carotid artery disease     Past Surgical History  Procedure Laterality Date  . Prostate surgery  2004  . Colonoscopy  03/12/2006    RMR: Normal rectum/ Markedly inflamed sigmoid colon with rectal sparing more proximal  colon.  The level of the cecum appeared normal, there were some small   cecal ulcers, markedly abnormal terminal ileum, status post biopsy of sigmoid colon and terminal ileum mucosa. Biopsy showed active colitis, nonspecific there  . Colonoscopy   02/25/2003    [id number] polyps in the rectum (destroyed with the tip of the snare unit) not described above.  Otherwise normal rectum/. Left-sided diverticula/Sessile polyps as described above in the sigmoid colon at 25 and 35 sm.. polyps benign/inflammatory  . Carotid endarterectomy Right 2011    Family History  Problem Relation Age of Onset  . Colon cancer Neg Hx   . CAD Mother   . CAD Brother     Age 7s  . CAD Sister     Age 78s    Social History Jeff [name] reports that he has quit smoking. His smoking use included Cigarettes. He smoked 0.00 packs per day. He does not have any smokeless tobacco history on file. Jeff [name] reports that he does not drink alcohol.  Review of Systems Complete review of systems negative except as otherwise outlined in  the clinical summary and also the following. He reports no palpitations, dizziness, or syncope. No claudication. Stable appetite. No bleeding episodes. No falls.  Physical Examination Filed Vitals:   08/10/14 0818  BP: 132/84  Pulse: 85   Filed Weights   08/10/14 0818  Weight: 196 lb (88.905 kg)   Overweight male, appears comfortable at rest. HEENT: Conjunctiva and lids normal, oropharynx clear. Neck: Supple, no elevated JVP or carotid bruits, no thyromegaly. Lungs: Clear to auscultation, nonlabored breathing at rest. Cardiac: Regular rate and rhythm, no S3 or significant systolic murmur, no pericardial  rub. Abdomen: Soft, nontender, bowel sounds present, no guarding or rebound. Extremities: No pitting edema, distal pulses 2+. Skin: Warm and dry. Musculoskeletal: No kyphosis. Neuropsychiatric: Alert and oriented x3, affect grossly appropriate.   Problem List and Plan   Family history of early CAD Two siblings as noted above with early heart disease. He already has personal history of atherosclerosis in the form of carotid artery disease and prior stroke in 2011, also hypertension and hyperlipidemia. Fortunately, he has not manifested any obvious angina symptoms, and seems to have reasonable functional status based on description of his work and exercise regimen. ECG is normal at baseline. We will further risk stratify with exercise echocardiogram (hold Toprol-XL for study), if this looks good, would continue  current efforts at medical therapy, diet, and exercise. If abnormal, we can discuss further.  Carotid artery occlusion  Less than 50% bilateral ICA stenoses by recent carotid Dopplers, status post prior right CEA.  He continues on aspirin and statin.  Essential hypertension Regular exercise plan. Also on Dyazide and Cozaar.  Mixed hyperlipidemia Good cholesterol control on Zocor, recent LDL 58.    Satira Sark, M.D., F.A.C.C.

## 2014-08-12 ENCOUNTER — Ambulatory Visit (HOSPITAL_COMMUNITY)
Admission: RE | Admit: 2014-08-12 | Discharge: 2014-08-12 | Disposition: A | Discharge status: Home / Self Care | Payer: Medicare HMO | Source: Ambulatory Visit | Attending: Cardiology | Admitting: Cardiology

## 2014-08-12 ENCOUNTER — Encounter (HOSPITAL_COMMUNITY): Payer: Self-pay

## 2014-08-12 DIAGNOSIS — I10 Essential (primary) hypertension: Secondary | ICD-10-CM | POA: Insufficient documentation

## 2014-08-12 DIAGNOSIS — I2510 Atherosclerotic heart disease of native coronary artery without angina pectoris: Secondary | ICD-10-CM | POA: Diagnosis present

## 2014-08-12 DIAGNOSIS — Z8249 Family history of ischemic heart disease and other diseases of the circulatory system: Secondary | ICD-10-CM

## 2014-08-12 DIAGNOSIS — I6523 Occlusion and stenosis of bilateral carotid arteries: Secondary | ICD-10-CM

## 2014-08-12 NOTE — Progress Notes (Signed)
Stress Lab Nurses Notes - [place] 08/12/2014 Reason for doing test: CAD & HTN Type of test: Stress Echo Nurse performing test: Gerrit Halls, RN Nuclear Medicine Tech: Not Applicable Echo Tech: Jamison Neighbor MD performing test: Koneswaran/M.Bonnell Public PA Family MD: Hilma Favors Test explained and consent signed: Yes.   IV started: No IV started Symptoms: Fatigue Treatment/Intervention: None Reason test stopped: fatigue After recovery IV was: NA Patient to return to Nuc. Med at : NA Patient discharged: Home Patient's Condition upon discharge was: stable Comments: During test peak BP 207/70 &  HR 171.  Recovery BP 132/71 & HR 115.  Symptoms resolved in recovery. Geanie Cooley T

## 2014-08-12 NOTE — Progress Notes (Signed)
  Echocardiogram Echocardiogram Stress Test has been performed.  Price, [place] 08/12/2014, 9:56 AM

## 2014-08-13 ENCOUNTER — Encounter (HOSPITAL_COMMUNITY): Payer: Self-pay | Admitting: Internal Medicine

## 2015-01-27 ENCOUNTER — Other Ambulatory Visit (HOSPITAL_COMMUNITY): Payer: Self-pay | Admitting: Family Medicine

## 2015-01-27 DIAGNOSIS — I7090 Unspecified atherosclerosis: Secondary | ICD-10-CM

## 2015-01-27 DIAGNOSIS — E782 Mixed hyperlipidemia: Secondary | ICD-10-CM

## 2015-01-27 DIAGNOSIS — I2510 Atherosclerotic heart disease of native coronary artery without angina pectoris: Secondary | ICD-10-CM

## 2015-01-27 DIAGNOSIS — I708 Atherosclerosis of other arteries: Secondary | ICD-10-CM

## 2015-01-29 ENCOUNTER — Ambulatory Visit (HOSPITAL_COMMUNITY): Payer: Medicare HMO

## 2015-02-05 ENCOUNTER — Ambulatory Visit (HOSPITAL_COMMUNITY): Admission: RE | Admit: 2015-02-05 | Payer: Medicare HMO | Source: Ambulatory Visit

## 2015-02-11 ENCOUNTER — Ambulatory Visit (HOSPITAL_COMMUNITY)
Admission: RE | Admit: 2015-02-11 | Discharge: 2015-02-11 | Disposition: A | Discharge status: Home / Self Care | Payer: Medicare HMO | Source: Ambulatory Visit | Attending: Family Medicine | Admitting: Family Medicine

## 2015-02-11 DIAGNOSIS — E782 Mixed hyperlipidemia: Secondary | ICD-10-CM

## 2015-02-11 DIAGNOSIS — Z8673 Personal history of transient ischemic attack (TIA), and cerebral infarction without residual deficits: Secondary | ICD-10-CM | POA: Diagnosis not present

## 2015-02-11 DIAGNOSIS — I7090 Unspecified atherosclerosis: Secondary | ICD-10-CM

## 2015-02-11 DIAGNOSIS — I708 Atherosclerosis of other arteries: Secondary | ICD-10-CM

## 2015-02-11 DIAGNOSIS — Z87891 Personal history of nicotine dependence: Secondary | ICD-10-CM | POA: Insufficient documentation

## 2015-02-11 DIAGNOSIS — I2510 Atherosclerotic heart disease of native coronary artery without angina pectoris: Secondary | ICD-10-CM | POA: Diagnosis present

## 2015-03-11 ENCOUNTER — Encounter: Payer: Self-pay | Admitting: *Deleted

## 2016-03-24 ENCOUNTER — Other Ambulatory Visit (HOSPITAL_COMMUNITY): Payer: Self-pay | Admitting: Registered Nurse

## 2016-03-24 DIAGNOSIS — I6521 Occlusion and stenosis of right carotid artery: Secondary | ICD-10-CM

## 2016-03-24 DIAGNOSIS — I7090 Unspecified atherosclerosis: Secondary | ICD-10-CM

## 2016-03-24 DIAGNOSIS — I10 Essential (primary) hypertension: Secondary | ICD-10-CM | POA: Diagnosis not present

## 2016-03-24 DIAGNOSIS — E669 Obesity, unspecified: Secondary | ICD-10-CM | POA: Diagnosis not present

## 2016-03-24 DIAGNOSIS — E663 Overweight: Secondary | ICD-10-CM | POA: Diagnosis not present

## 2016-03-24 DIAGNOSIS — Z0000 Encounter for general adult medical examination without abnormal findings: Secondary | ICD-10-CM | POA: Diagnosis not present

## 2016-03-24 DIAGNOSIS — Z6826 Body mass index (BMI) 26.0-26.9, adult: Secondary | ICD-10-CM | POA: Diagnosis not present

## 2016-03-24 DIAGNOSIS — I708 Atherosclerosis of other arteries: Secondary | ICD-10-CM

## 2016-03-24 DIAGNOSIS — I739 Peripheral vascular disease, unspecified: Secondary | ICD-10-CM | POA: Diagnosis not present

## 2016-03-24 DIAGNOSIS — E782 Mixed hyperlipidemia: Secondary | ICD-10-CM | POA: Diagnosis not present

## 2016-03-24 DIAGNOSIS — Z1389 Encounter for screening for other disorder: Secondary | ICD-10-CM | POA: Diagnosis not present

## 2016-03-30 ENCOUNTER — Ambulatory Visit (HOSPITAL_COMMUNITY)
Admission: RE | Admit: 2016-03-30 | Discharge: 2016-03-30 | Disposition: A | Discharge status: Home / Self Care | Payer: Medicare HMO | Source: Ambulatory Visit | Attending: Registered Nurse | Admitting: Registered Nurse

## 2016-03-30 DIAGNOSIS — Z9889 Other specified postprocedural states: Secondary | ICD-10-CM | POA: Insufficient documentation

## 2016-03-30 DIAGNOSIS — I7090 Unspecified atherosclerosis: Secondary | ICD-10-CM

## 2016-03-30 DIAGNOSIS — I708 Atherosclerosis of other arteries: Secondary | ICD-10-CM

## 2016-03-30 DIAGNOSIS — I6521 Occlusion and stenosis of right carotid artery: Secondary | ICD-10-CM | POA: Diagnosis not present

## 2016-03-30 DIAGNOSIS — I6523 Occlusion and stenosis of bilateral carotid arteries: Secondary | ICD-10-CM | POA: Diagnosis not present

## 2016-06-07 DIAGNOSIS — Z23 Encounter for immunization: Secondary | ICD-10-CM | POA: Diagnosis not present

## 2016-10-02 DIAGNOSIS — Z972 Presence of dental prosthetic device (complete) (partial): Secondary | ICD-10-CM | POA: Diagnosis not present

## 2016-10-02 DIAGNOSIS — H259 Unspecified age-related cataract: Secondary | ICD-10-CM | POA: Diagnosis not present

## 2016-10-02 DIAGNOSIS — I10 Essential (primary) hypertension: Secondary | ICD-10-CM | POA: Diagnosis not present

## 2016-10-02 DIAGNOSIS — E7800 Pure hypercholesterolemia, unspecified: Secondary | ICD-10-CM | POA: Diagnosis not present

## 2016-10-02 DIAGNOSIS — Z0000 Encounter for general adult medical examination without abnormal findings: Secondary | ICD-10-CM | POA: Diagnosis not present

## 2016-10-02 DIAGNOSIS — Z7982 Long term (current) use of aspirin: Secondary | ICD-10-CM | POA: Diagnosis not present

## 2016-10-02 DIAGNOSIS — E785 Hyperlipidemia, unspecified: Secondary | ICD-10-CM | POA: Diagnosis not present

## 2016-10-02 DIAGNOSIS — Z6826 Body mass index (BMI) 26.0-26.9, adult: Secondary | ICD-10-CM | POA: Diagnosis not present

## 2016-10-02 DIAGNOSIS — Z8546 Personal history of malignant neoplasm of prostate: Secondary | ICD-10-CM | POA: Diagnosis not present

## 2017-02-06 DIAGNOSIS — H5200 Hypermetropia, unspecified eye: Secondary | ICD-10-CM | POA: Diagnosis not present

## 2017-02-06 DIAGNOSIS — H25813 Combined forms of age-related cataract, bilateral: Secondary | ICD-10-CM | POA: Diagnosis not present

## 2017-02-12 IMAGING — US US CAROTID DUPLEX BILAT
1 series · 13 of 24 positions shown · non-contrast
Comparison: 02/11/2015.

CLINICAL DATA: Carotid artery stenosis. Prior right carotid
endarterectomy.

EXAM:
BILATERAL CAROTID DUPLEX ULTRASOUND
TECHNIQUE: Gray scale imaging, color Doppler and duplex ultrasound were
performed of bilateral carotid and vertebral arteries in the neck.

[Series 1: us carotid duplex bilat · 0.06mm/px · 13 of 67 slices shown]
[im 1/67]
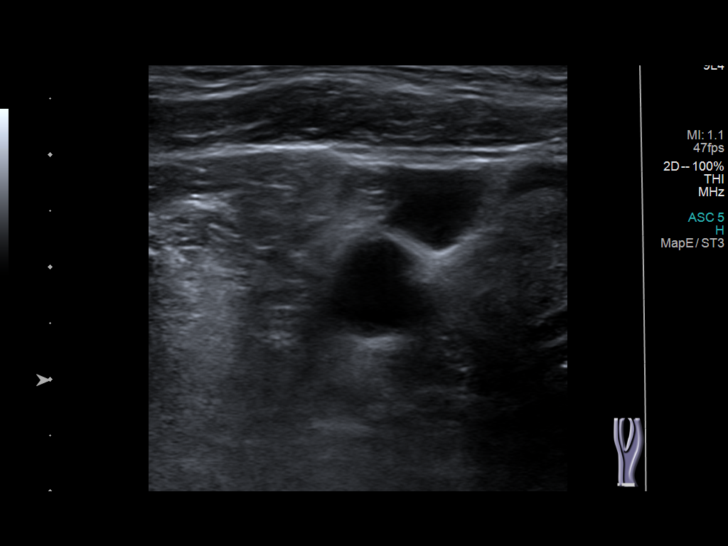
[im 6/67]
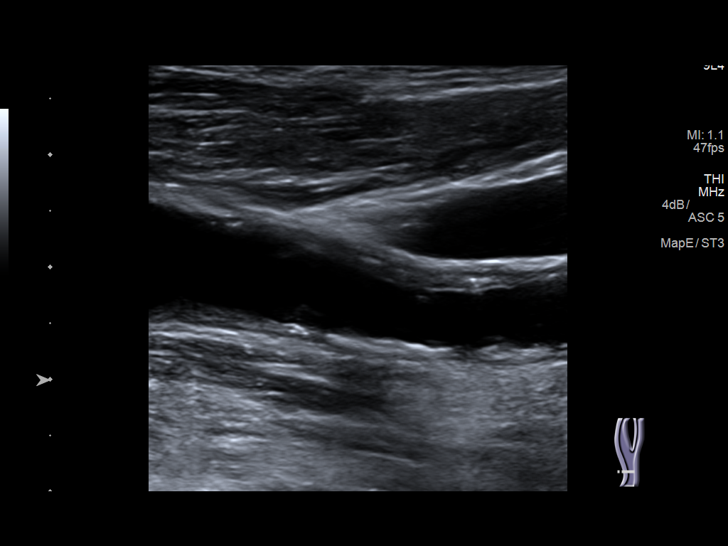
[im 12/67]
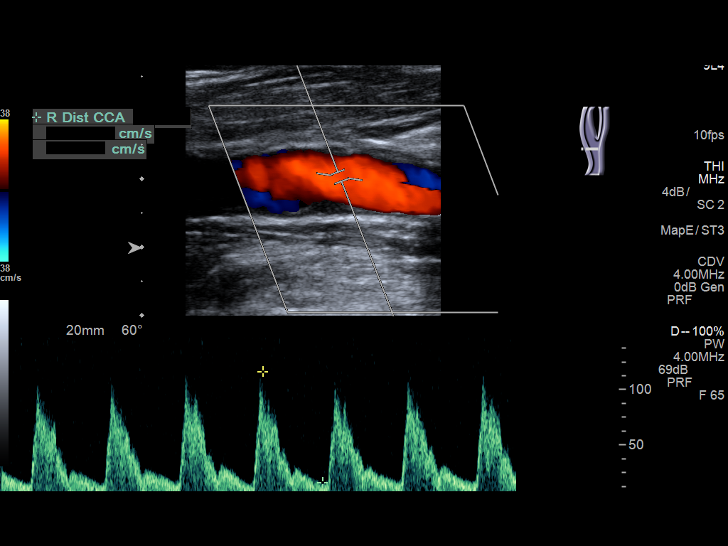
[im 18/67]
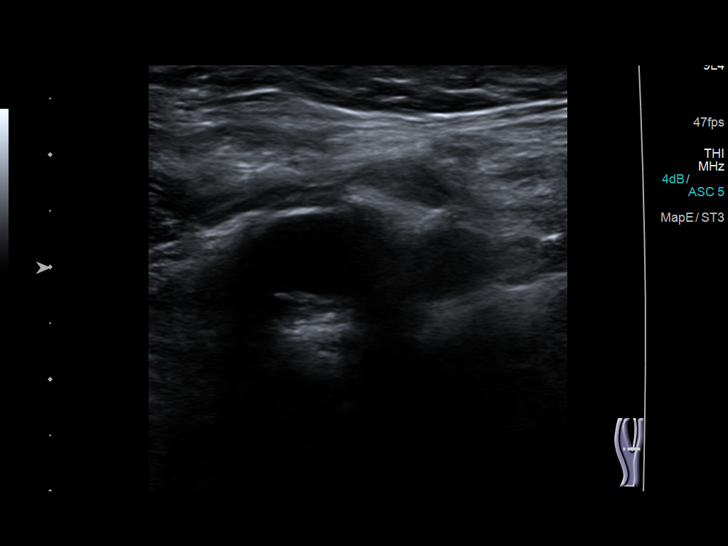
[im 23/67]
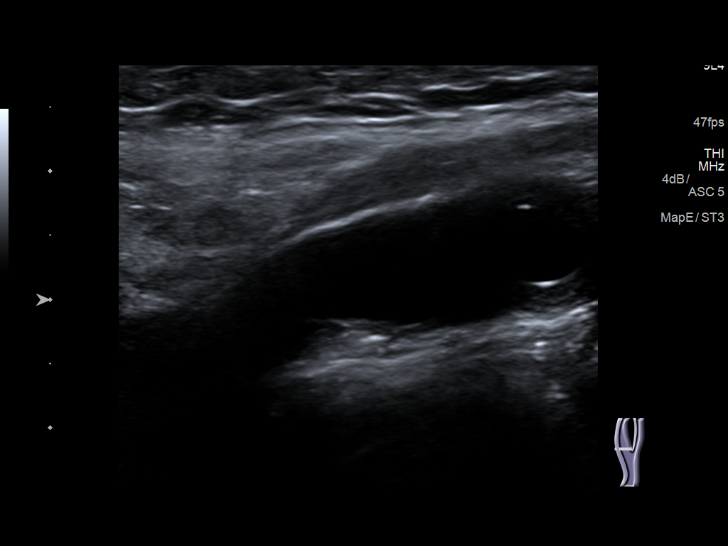
[im 29/67]
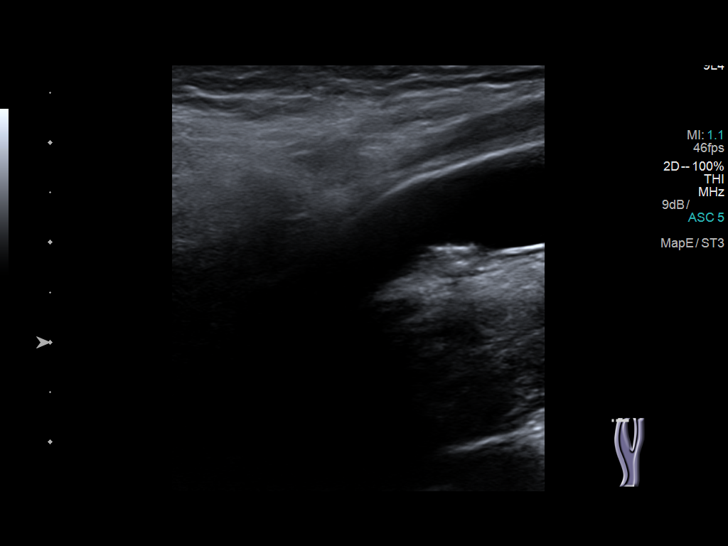
[im 35/67]
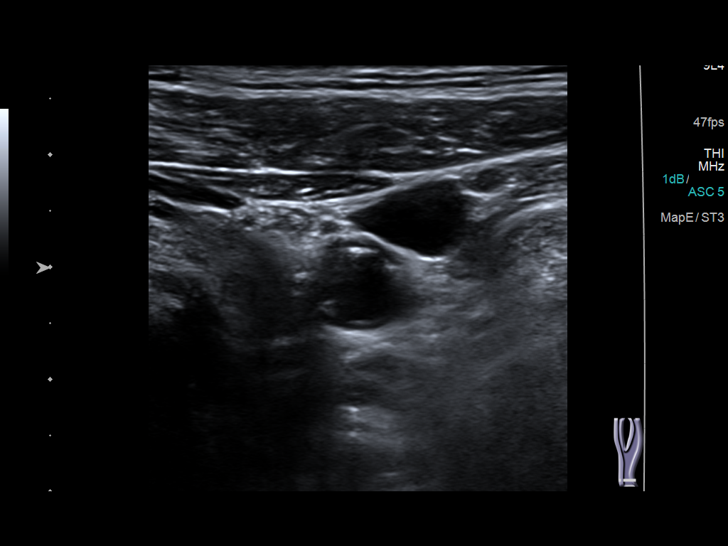
[im 38/67]
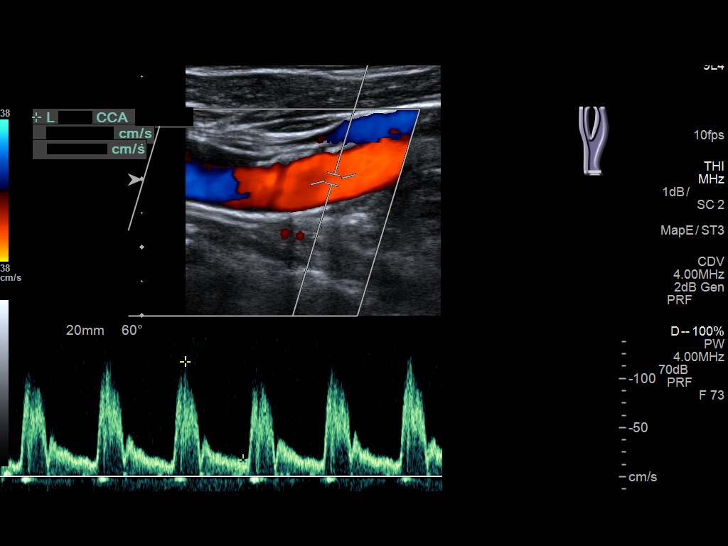
[im 44/67]
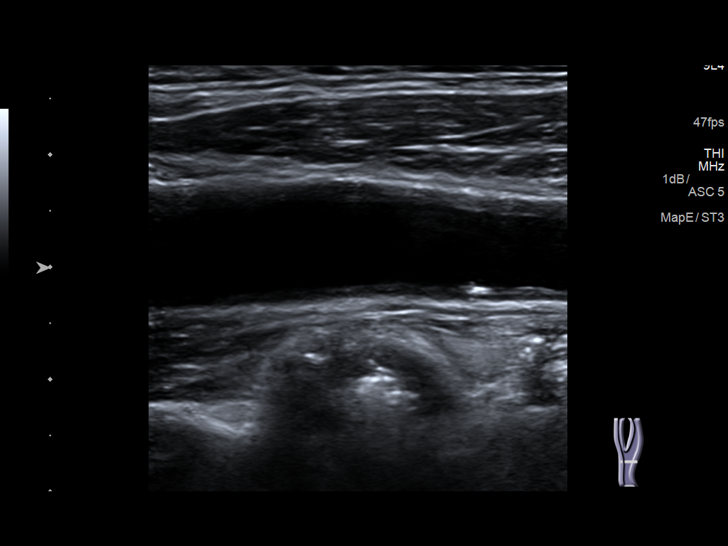
[im 49/67]
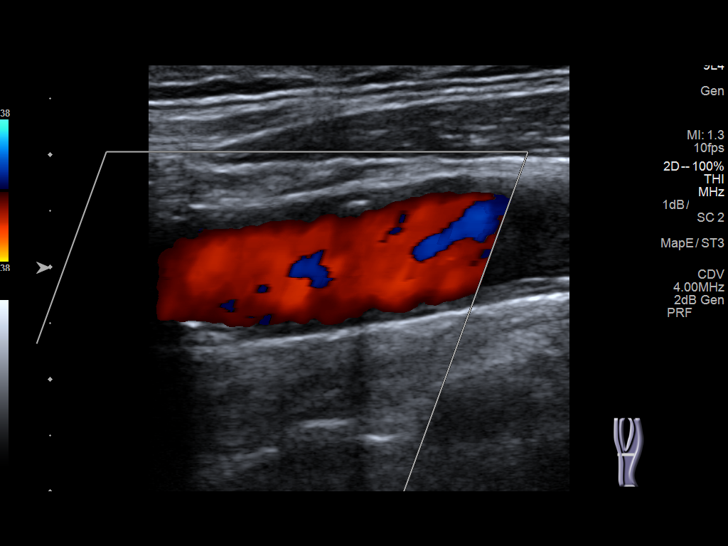
[im 55/67]
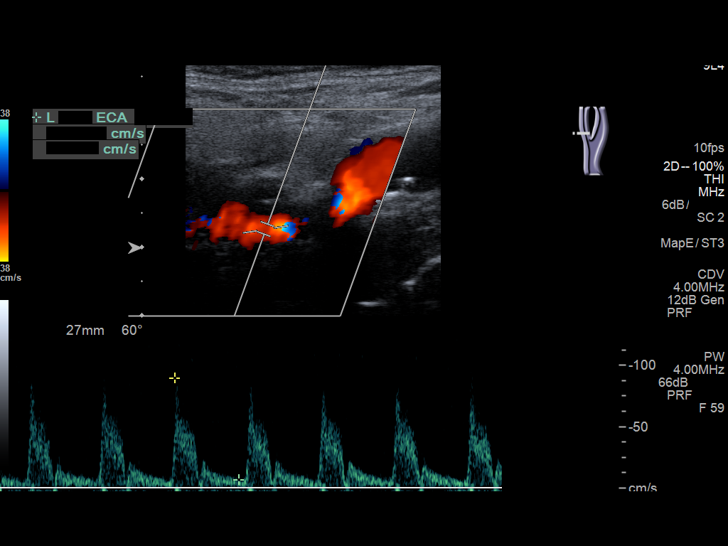
[im 61/67]
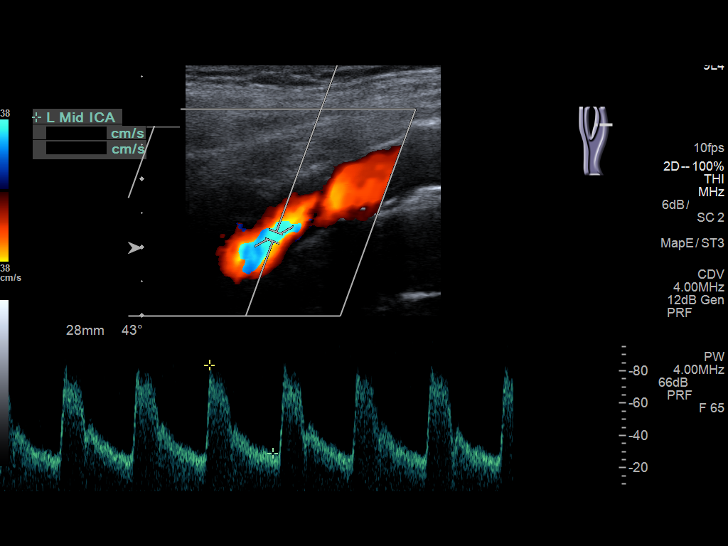
[im 67/67]
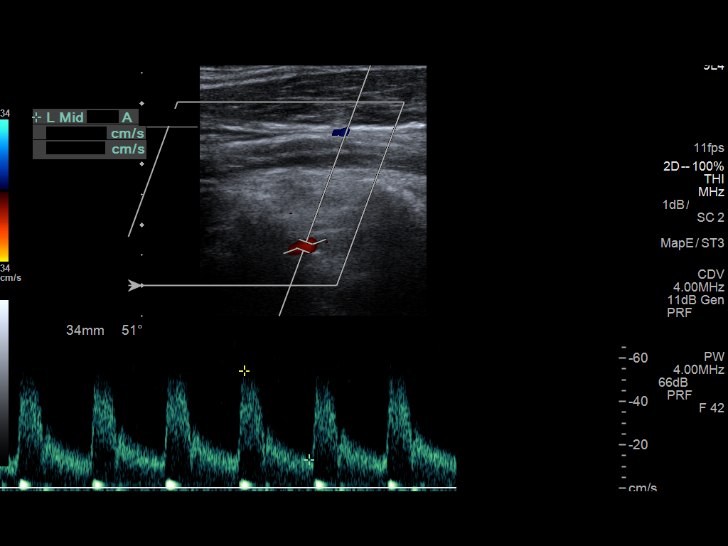

[13 of 24 positions shown; findings below may reference images not displayed]

FINDINGS: Criteria: Quantification of carotid stenosis is based on velocity
parameters that correlate the residual internal carotid diameter
with NASCET-based stenosis levels, using the diameter of the distal
internal carotid lumen as the denominator for stenosis measurement.

The following velocity measurements were obtained:

RIGHT

ICA:  73/23 cm/sec

CCA:  135/20 cm/sec

SYSTOLIC ICA/CCA RATIO:

DIASTOLIC ICA/CCA RATIO:

ECA:  140 cm/sec

LEFT

ICA:  84/29 cm/sec

CCA:  160/24 cm/sec

SYSTOLIC ICA/CCA RATIO:

DIASTOLIC ICA/CCA RATIO:

ECA:  90 cm/sec

RIGHT CAROTID ARTERY: Endarterectomy site is widely patent. Diffuse
mild right common carotid and carotid bifurcation atherosclerotic
vascular disease. No flow limiting stenosis.

RIGHT VERTEBRAL ARTERY:  Patent with antegrade flow.

LEFT CAROTID ARTERY: Diffuse mild common carotid carotid bifurcation
atherosclerotic-disease. No flow limiting stenosis.

LEFT VERTEBRAL ARTERY:  Patent antegrade flow .
IMPRESSION: 1. Right carotid endarterectomy site is widely patent. Diffuse mild
bilateral common carotid carotid bifurcation atherosclerotic
vascular disease. No flow limiting stenoses. Degree of stenosis less
than 50% bilaterally.

2.  Vertebral arteries are patent antegrade flow .

## 2017-03-28 DIAGNOSIS — Z6826 Body mass index (BMI) 26.0-26.9, adult: Secondary | ICD-10-CM | POA: Diagnosis not present

## 2017-03-28 DIAGNOSIS — Z0000 Encounter for general adult medical examination without abnormal findings: Secondary | ICD-10-CM | POA: Diagnosis not present

## 2017-03-29 DIAGNOSIS — Z1389 Encounter for screening for other disorder: Secondary | ICD-10-CM | POA: Diagnosis not present

## 2017-03-29 DIAGNOSIS — Z6826 Body mass index (BMI) 26.0-26.9, adult: Secondary | ICD-10-CM | POA: Diagnosis not present

## 2017-03-29 DIAGNOSIS — Z0000 Encounter for general adult medical examination without abnormal findings: Secondary | ICD-10-CM | POA: Diagnosis not present

## 2017-05-03 DIAGNOSIS — Z23 Encounter for immunization: Secondary | ICD-10-CM | POA: Diagnosis not present

## 2018-03-01 DIAGNOSIS — H52223 Regular astigmatism, bilateral: Secondary | ICD-10-CM | POA: Diagnosis not present

## 2018-03-01 DIAGNOSIS — H5203 Hypermetropia, bilateral: Secondary | ICD-10-CM | POA: Diagnosis not present

## 2018-03-01 DIAGNOSIS — H25813 Combined forms of age-related cataract, bilateral: Secondary | ICD-10-CM | POA: Diagnosis not present

## 2018-03-01 DIAGNOSIS — H524 Presbyopia: Secondary | ICD-10-CM | POA: Diagnosis not present

## 2018-04-03 ENCOUNTER — Other Ambulatory Visit (HOSPITAL_COMMUNITY): Payer: Self-pay | Admitting: Family Medicine

## 2018-04-03 DIAGNOSIS — I2510 Atherosclerotic heart disease of native coronary artery without angina pectoris: Secondary | ICD-10-CM | POA: Diagnosis not present

## 2018-04-03 DIAGNOSIS — C61 Malignant neoplasm of prostate: Secondary | ICD-10-CM | POA: Diagnosis not present

## 2018-04-03 DIAGNOSIS — I10 Essential (primary) hypertension: Secondary | ICD-10-CM | POA: Diagnosis not present

## 2018-04-03 DIAGNOSIS — Z23 Encounter for immunization: Secondary | ICD-10-CM | POA: Diagnosis not present

## 2018-04-03 DIAGNOSIS — I739 Peripheral vascular disease, unspecified: Secondary | ICD-10-CM | POA: Diagnosis not present

## 2018-04-03 DIAGNOSIS — Z0001 Encounter for general adult medical examination with abnormal findings: Secondary | ICD-10-CM | POA: Diagnosis not present

## 2018-04-03 DIAGNOSIS — I6521 Occlusion and stenosis of right carotid artery: Secondary | ICD-10-CM | POA: Diagnosis not present

## 2018-04-03 DIAGNOSIS — I708 Atherosclerosis of other arteries: Secondary | ICD-10-CM | POA: Diagnosis not present

## 2018-04-03 DIAGNOSIS — Z8673 Personal history of transient ischemic attack (TIA), and cerebral infarction without residual deficits: Secondary | ICD-10-CM | POA: Diagnosis not present

## 2018-04-03 DIAGNOSIS — R7309 Other abnormal glucose: Secondary | ICD-10-CM | POA: Diagnosis not present

## 2018-04-03 DIAGNOSIS — Z6827 Body mass index (BMI) 27.0-27.9, adult: Secondary | ICD-10-CM | POA: Diagnosis not present

## 2018-04-03 DIAGNOSIS — E782 Mixed hyperlipidemia: Secondary | ICD-10-CM | POA: Diagnosis not present

## 2018-05-01 DIAGNOSIS — D582 Other hemoglobinopathies: Secondary | ICD-10-CM | POA: Diagnosis not present

## 2018-06-17 DIAGNOSIS — H2513 Age-related nuclear cataract, bilateral: Secondary | ICD-10-CM | POA: Diagnosis not present

## 2018-07-17 ENCOUNTER — Other Ambulatory Visit (HOSPITAL_COMMUNITY): Payer: Self-pay

## 2018-07-22 ENCOUNTER — Encounter (HOSPITAL_COMMUNITY): Admission: RE | Payer: Self-pay | Source: Ambulatory Visit

## 2018-07-22 ENCOUNTER — Ambulatory Visit (HOSPITAL_COMMUNITY): Admission: RE | Admit: 2018-07-22 | Payer: Self-pay | Source: Ambulatory Visit | Admitting: Ophthalmology

## 2018-07-22 SURGERY — PHACOEMULSIFICATION, CATARACT, WITH IOL INSERTION
Anesthesia: Monitor Anesthesia Care | Site: Eye | Laterality: Left

## 2018-09-30 DIAGNOSIS — H43812 Vitreous degeneration, left eye: Secondary | ICD-10-CM | POA: Diagnosis not present

## 2018-09-30 DIAGNOSIS — H25813 Combined forms of age-related cataract, bilateral: Secondary | ICD-10-CM | POA: Diagnosis not present

## 2018-09-30 DIAGNOSIS — H52223 Regular astigmatism, bilateral: Secondary | ICD-10-CM | POA: Diagnosis not present

## 2018-11-05 ENCOUNTER — Other Ambulatory Visit (HOSPITAL_COMMUNITY): Payer: Medicare HMO

## 2018-11-11 ENCOUNTER — Ambulatory Visit: Admit: 2018-11-11 | Payer: Medicare HMO | Admitting: Ophthalmology

## 2018-11-11 SURGERY — PHACOEMULSIFICATION, CATARACT, WITH IOL INSERTION
Anesthesia: Monitor Anesthesia Care | Laterality: Left

## 2018-11-19 ENCOUNTER — Other Ambulatory Visit (HOSPITAL_COMMUNITY): Payer: Medicare HMO

## 2018-11-25 ENCOUNTER — Ambulatory Visit: Admit: 2018-11-25 | Payer: Medicare HMO | Admitting: Ophthalmology

## 2018-11-25 SURGERY — PHACOEMULSIFICATION, CATARACT, WITH IOL INSERTION
Anesthesia: Monitor Anesthesia Care | Laterality: Right

## 2019-01-13 DIAGNOSIS — H25812 Combined forms of age-related cataract, left eye: Secondary | ICD-10-CM | POA: Diagnosis not present

## 2019-01-13 NOTE — Patient Instructions (Signed)
Your procedure is scheduled on:  01/20/2019              Report to [facility] at   10:30  AM.  Call this number if you have problems the morning of surgery: [phone]   Remember:   Do not eat or drink :After Midnight.    Take these medicines the morning of surgery with A SIP OF WATER:     Losartin, Metoprolol, and Singulair       Do not wear jewelry, make-up or nail polish.  Do not wear lotions, powders, or perfumes. You may wear deodorant.  Do not bring valuables to the hospital.  Contacts, dentures or bridgework may not be worn into surgery.  Patients discharged the day of surgery will not be allowed to drive home.  Name and phone number of your driver.                                                                                                                                       Cataract Surgery  A cataract is a clouding of the lens of the eye. When a lens becomes cloudy, vision is reduced based on the degree and nature of the clouding. Surgery may be needed to improve vision. Surgery removes the cloudy lens and usually replaces it with a substitute lens (intraocular lens, IOL). LET YOUR EYE DOCTOR KNOW ABOUT:  Allergies to food or medicine.   Medicines taken including herbs, eyedrops, over-the-counter medicines, and creams.   Use of steroids (by mouth or creams).   Previous problems with anesthetics or numbing medicine.   History of bleeding problems or blood clots.   Previous surgery.   Other health problems, including diabetes and kidney problems.   Possibility of pregnancy, if this applies.  RISKS AND COMPLICATIONS  Infection.   Inflammation of the eyeball (endophthalmitis) that can spread to both eyes (sympathetic ophthalmia).   Poor wound healing.   If an IOL is inserted, it can later fall out of proper position. This is very uncommon.   Clouding of the part of your eye that holds an IOL in place. This is called an "after-cataract." These are uncommon,  but easily treated.  BEFORE THE PROCEDURE  Do not eat or drink anything except small amounts of water for 8 to 12 before your surgery, or as directed by your caregiver.    Unless you are told otherwise, continue any eyedrops you have been prescribed.   Talk to your primary caregiver about all other medicines that you take (both prescription and non-prescription). In some cases, you may need to stop or change medicines near the time of your surgery. This is most important if you are taking blood-thinning medicine. Do not stop medicines unless you are told to do so.   Arrange for someone to drive you to and from the procedure.   Do not put contact lenses in either  eye on the day of your surgery.  PROCEDURE There is more than one method for safely removing a cataract. Your doctor can explain the differences and help determine which is best for you. Phacoemulsification surgery is the most common form of cataract surgery.  An injection is given behind the eye or eyedrops are given to make this a painless procedure.   A small cut (incision) is made on the edge of the clear, dome-shaped surface that covers the front of the eye (cornea).   A tiny probe is painlessly inserted into the eye. This device gives off ultrasound waves that soften and break up the cloudy center of the lens. This makes it easier for the cloudy lens to be removed by suction.   An IOL may be implanted.   The normal lens of the eye is covered by a clear capsule. Part of that capsule is intentionally left in the eye to support the IOL.   Your surgeon may or may not use stitches to close the incision.  There are other forms of cataract surgery that require a larger incision and stiches to close the eye. This approach is taken in cases where the doctor feels that the cataract cannot be easily removed using phacoemulsification. AFTER THE PROCEDURE  When an IOL is implanted, it does not need care. It becomes a permanent part of  your eye and cannot be seen or felt.   Your doctor will schedule follow-up exams to check on your progress.   Review your other medicines with your doctor to see which can be resumed after surgery.   Use eyedrops or take medicine as prescribed by your doctor.  Document Released: 07/06/2011 Document Reviewed: 07/03/2011 [facility] Patient Information 2012 [place].  .Cataract Surgery Care After Refer to this sheet in the next few weeks. These instructions provide you with information on caring for yourself after your procedure. Your caregiver may also give you more specific instructions. Your treatment has been planned according to current medical practices, but problems sometimes occur. Call your caregiver if you have any problems or questions after your procedure.  HOME CARE INSTRUCTIONS   Avoid strenuous activities as directed by your caregiver.   Ask your caregiver when you can resume driving.   Use eyedrops or other medicines to help healing and control pressure inside your eye as directed by your caregiver.   Only take over-the-counter or prescription medicines for pain, discomfort, or fever as directed by your caregiver.   Do not to touch or rub your eyes.   You may be instructed to use a protective shield during the first few days and nights after surgery. If not, wear sunglasses to protect your eyes. This is to protect the eye from pressure or from being accidentally bumped.   Keep the area around your eye clean and dry. Avoid swimming or allowing water to hit you directly in the face while showering. Keep soap and shampoo out of your eyes.   Do not bend or lift heavy objects. Bending increases pressure in the eye. You can walk, climb stairs, and do light household chores.   Do not put a contact lens into the eye that had surgery until your caregiver says it is okay to do so.   Ask your doctor when you can return to work. This will depend on the kind of work that you do.  If you work in a dusty environment, you may be advised to wear protective eyewear for a period of time.  Ask your caregiver when it will be safe to engage in sexual activity.   Continue with your regular eye exams as directed by your caregiver.  What to expect:  It is normal to feel itching and mild discomfort for a few days after cataract surgery. Some fluid discharge is also common, and your eye may be sensitive to light and touch.   After 1 to 2 days, even moderate discomfort should disappear. In most cases, healing will take about 6 weeks.   If you received an intraocular lens (IOL), you may notice that colors are very bright or have a blue tinge. Also, if you have been in bright sunlight, everything may appear reddish for a few hours. If you see these color tinges, it is because your lens is clear and no longer cloudy. Within a few months after receiving an IOL, these extra colors should go away. When you have healed, you will probably need new glasses.  SEEK MEDICAL CARE IF:   You have increased bruising around your eye.   You have discomfort not helped by medicine.  SEEK IMMEDIATE MEDICAL CARE IF:   You have a  fever.   You have a worsening or sudden vision loss.   You have redness, swelling, or increasing pain in the eye.   You have a thick discharge from the eye that had surgery.  MAKE SURE YOU:  Understand these instructions.   Will watch your condition.   Will get help right away if you are not doing well or get worse.  Document Released: 02/03/2005 Document Revised: 07/06/2011 Document Reviewed: 03/10/2011 [facility] Patient Information 2012 [place].    Monitored Anesthesia Care  Monitored anesthesia care is an anesthesia service for a medical procedure. Anesthesia is the loss of the ability to feel pain. It is produced by medications called anesthetics. It may affect a small area of your body (local anesthesia), a large area of your body (regional  anesthesia), or your entire body (general anesthesia). The need for monitored anesthesia care depends your procedure, your condition, and the potential need for regional or general anesthesia. It is often provided during procedures where:   General anesthesia may be needed if there are complications. This is because you need special care when you are under general anesthesia.    You will be under local or regional anesthesia. This is so that you are able to have higher levels of anesthesia if needed.    You will receive calming medications (sedatives). This is especially the case if sedatives are given to put you in a semi-conscious state of relaxation (deep sedation). This is because the amount of sedative needed to produce this state can be hard to predict. Too much of a sedative can produce general anesthesia. Monitored anesthesia care is performed by one or more caregivers who have special training in all types of anesthesia. You will need to meet with these caregivers before your procedure. During this meeting, they will ask you about your medical history. They will also give you instructions to follow. (For example, you will need to stop eating and drinking before your procedure. You may also need to stop or change medications you are taking.) During your procedure, your caregivers will stay with you. They will:   Watch your condition. This includes watching you blood pressure, breathing, and level of pain.    Diagnose and treat problems that occur.    Give medications if they are needed. These may include calming medications (sedatives) and anesthetics.  Make sure you are comfortable.   Having monitored anesthesia care does not necessarily mean that you will be under anesthesia. It does mean that your caregivers will be able to manage anesthesia if you need it or if it occurs. It also means that you will be able to have a different type of anesthesia than you are having if you need it. When  your procedure is complete, your caregivers will continue to watch your condition. They will make sure any medications wear off before you are allowed to go home.  Document Released: 04/12/2005 Document Revised: 11/11/2012 Document Reviewed: 08/28/2012 [facility] Patient Information 2014 [place], [place].

## 2019-01-16 ENCOUNTER — Other Ambulatory Visit: Payer: Self-pay

## 2019-01-16 ENCOUNTER — Encounter (HOSPITAL_COMMUNITY)
Admission: RE | Admit: 2019-01-16 | Discharge: 2019-01-16 | Disposition: A | Discharge status: Home / Self Care | Payer: Medicare HMO | Source: Ambulatory Visit | Attending: Ophthalmology | Admitting: Ophthalmology

## 2019-01-16 ENCOUNTER — Other Ambulatory Visit (HOSPITAL_COMMUNITY)
Admission: RE | Admit: 2019-01-16 | Discharge: 2019-01-16 | Disposition: A | Discharge status: Home / Self Care | Payer: Medicare HMO | Source: Ambulatory Visit | Attending: Ophthalmology | Admitting: Ophthalmology

## 2019-01-16 ENCOUNTER — Encounter (HOSPITAL_COMMUNITY): Payer: Self-pay

## 2019-01-16 DIAGNOSIS — Z1159 Encounter for screening for other viral diseases: Secondary | ICD-10-CM | POA: Insufficient documentation

## 2019-01-16 DIAGNOSIS — Z01812 Encounter for preprocedural laboratory examination: Secondary | ICD-10-CM | POA: Diagnosis not present

## 2019-01-16 LAB — BASIC METABOLIC PANEL
Anion gap: 7 (ref 5–15)
BUN: 10 mg/dL (ref 8–23)
CO2: 25 mmol/L (ref 22–32)
Calcium: 9.1 mg/dL (ref 8.9–10.3)
Chloride: 108 mmol/L (ref 98–111)
Creatinine, Ser: 0.75 mg/dL (ref 0.61–1.24)
GFR calc Af Amer: >60 mL/min (ref >60)
GFR calc non Af Amer: >60 mL/min (ref >60)
Glucose, Bld: 109 mg/dL — ABNORMAL HIGH (ref 70–99)
Potassium: 3.7 mmol/L (ref 3.5–5.1)
Sodium: 140 mmol/L (ref 135–145)

## 2019-01-16 NOTE — Pre-Procedure Instructions (Signed)
Please excuse [name] from work on 01/16/2019. He is having surgery on 01/20/2019 and has to have a COVID-19 test done today after which time he has to be in quarantine until after his surgery. If you have any questions please contact us at [facility], [phone]. Thank you.  Delphia Grates, RN

## 2019-01-17 LAB — NOVEL CORONAVIRUS, NAA (HOSP ORDER, SEND-OUT TO REF LAB; TAT 18-24 HRS): SARS-CoV-2, NAA: NOT DETECTED

## 2019-01-20 ENCOUNTER — Encounter (HOSPITAL_COMMUNITY)
Admission: RE | Disposition: A | Discharge status: Home / Self Care | Payer: Self-pay | Source: Home / Self Care | Attending: Ophthalmology

## 2019-01-20 ENCOUNTER — Ambulatory Visit (HOSPITAL_COMMUNITY)
Admission: RE | Admit: 2019-01-20 | Discharge: 2019-01-20 | Disposition: A | Discharge status: Home / Self Care | Payer: Medicare HMO | Attending: Ophthalmology | Admitting: Ophthalmology

## 2019-01-20 ENCOUNTER — Ambulatory Visit (HOSPITAL_COMMUNITY): Payer: Medicare HMO | Admitting: Anesthesiology

## 2019-01-20 ENCOUNTER — Encounter (HOSPITAL_COMMUNITY): Payer: Self-pay | Admitting: *Deleted

## 2019-01-20 DIAGNOSIS — Z8673 Personal history of transient ischemic attack (TIA), and cerebral infarction without residual deficits: Secondary | ICD-10-CM | POA: Diagnosis not present

## 2019-01-20 DIAGNOSIS — Z8546 Personal history of malignant neoplasm of prostate: Secondary | ICD-10-CM | POA: Diagnosis not present

## 2019-01-20 DIAGNOSIS — K219 Gastro-esophageal reflux disease without esophagitis: Secondary | ICD-10-CM | POA: Insufficient documentation

## 2019-01-20 DIAGNOSIS — E7800 Pure hypercholesterolemia, unspecified: Secondary | ICD-10-CM | POA: Insufficient documentation

## 2019-01-20 DIAGNOSIS — H2512 Age-related nuclear cataract, left eye: Secondary | ICD-10-CM | POA: Diagnosis not present

## 2019-01-20 DIAGNOSIS — Z79899 Other long term (current) drug therapy: Secondary | ICD-10-CM | POA: Insufficient documentation

## 2019-01-20 DIAGNOSIS — H25812 Combined forms of age-related cataract, left eye: Secondary | ICD-10-CM | POA: Diagnosis not present

## 2019-01-20 DIAGNOSIS — H259 Unspecified age-related cataract: Secondary | ICD-10-CM | POA: Insufficient documentation

## 2019-01-20 DIAGNOSIS — Z87891 Personal history of nicotine dependence: Secondary | ICD-10-CM | POA: Insufficient documentation

## 2019-01-20 DIAGNOSIS — I10 Essential (primary) hypertension: Secondary | ICD-10-CM | POA: Insufficient documentation

## 2019-01-20 DIAGNOSIS — I2510 Atherosclerotic heart disease of native coronary artery without angina pectoris: Secondary | ICD-10-CM | POA: Diagnosis not present

## 2019-01-20 DIAGNOSIS — Z961 Presence of intraocular lens: Secondary | ICD-10-CM | POA: Diagnosis not present

## 2019-01-20 DIAGNOSIS — Z9842 Cataract extraction status, left eye: Secondary | ICD-10-CM | POA: Diagnosis not present

## 2019-01-20 HISTORY — PX: CATARACT EXTRACTION W/PHACO: SHX586

## 2019-01-20 SURGERY — PHACOEMULSIFICATION, CATARACT, WITH IOL INSERTION
Anesthesia: Monitor Anesthesia Care | Site: Eye | Laterality: Left

## 2019-01-20 MED ORDER — LIDOCAINE HCL 3.5 % OP GEL
1.0000 "application " | Freq: Once | OPHTHALMIC | Status: AC
  Administered 2019-01-20: 1 via OPHTHALMIC

## 2019-01-20 MED ORDER — BSS IO SOLN
INTRAOCULAR | Status: DC | PRN
  Administered 2019-01-20: 15 mL via INTRAOCULAR

## 2019-01-20 MED ORDER — SODIUM HYALURONATE 23 MG/ML IO SOLN
INTRAOCULAR | Status: DC | PRN
  Administered 2019-01-20: 0.6 mL via INTRAOCULAR

## 2019-01-20 MED ORDER — EPINEPHRINE PF 1 MG/ML IJ SOLN
INTRAMUSCULAR | Status: AC
  Filled 2019-01-20: qty 2

## 2019-01-20 MED ORDER — PROVISC 10 MG/ML IO SOLN
INTRAOCULAR | Status: DC | PRN
  Administered 2019-01-20: 0.85 mL via INTRAOCULAR

## 2019-01-20 MED ORDER — TETRACAINE HCL 0.5 % OP SOLN
1.0000 [drp] | OPHTHALMIC | Status: AC
  Administered 2019-01-20 (×3): 1 [drp] via OPHTHALMIC

## 2019-01-20 MED ORDER — CYCLOPENTOLATE-PHENYLEPHRINE 0.2-1 % OP SOLN
1.0000 [drp] | OPHTHALMIC | Status: AC
  Administered 2019-01-20 (×3): 1 [drp] via OPHTHALMIC

## 2019-01-20 MED ORDER — POVIDONE-IODINE 5 % OP SOLN
OPHTHALMIC | Status: DC | PRN
  Administered 2019-01-20: 1 via OPHTHALMIC

## 2019-01-20 MED ORDER — NEOMYCIN-POLYMYXIN-DEXAMETH 3.5-10000-0.1 OP SUSP
OPHTHALMIC | Status: DC | PRN
  Administered 2019-01-20: 2 [drp] via OPHTHALMIC

## 2019-01-20 MED ORDER — LIDOCAINE HCL (PF) 1 % IJ SOLN
INTRAOCULAR | Status: DC | PRN
  Administered 2019-01-20: 1 mL via OPHTHALMIC

## 2019-01-20 MED ORDER — EPINEPHRINE PF 1 MG/ML IJ SOLN
INTRAOCULAR | Status: DC | PRN
  Administered 2019-01-20: 500 mL

## 2019-01-20 MED ORDER — PHENYLEPHRINE HCL 2.5 % OP SOLN
1.0000 [drp] | OPHTHALMIC | Status: AC
  Administered 2019-01-20 (×3): 1 [drp] via OPHTHALMIC

## 2019-01-20 SURGICAL SUPPLY — 13 items

## 2019-01-20 NOTE — Op Note (Signed)
Date of procedure: 01/20/19  Pre-operative diagnosis: Visually significant age-related cataract, Left Eye (H25.812)  Post-operative diagnosis: Visually significant age-related cataract, Left Eye  Procedure: Removal of cataract via phacoemulsification and insertion of intra-ocular lens [employer] PCB00  +20.5D into the capsular bag of the Left Eye  Attending surgeon: Gerda Diss. Terence Bart, MD, MA  Anesthesia: MAC, Topical Akten  Complications: None  Estimated Blood Loss: <42m (minimal)  Specimens: None  Implants: As above  Indications:  Visually significant age-related cataract, Left Eye  Procedure:  The patient was seen and identified in the pre-operative area. The operative eye was identified and dilated.  The operative eye was marked.  Topical anesthesia was administered to the operative eye.     The patient was then to the operative suite and placed in the supine position.  A timeout was performed confirming the patient, procedure to be performed, and all other relevant information.   The patient's face was prepped and draped in the usual fashion for intra-ocular surgery.  A lid speculum was placed into the operative eye and the surgical microscope moved into place and focused.  An inferotemporal paracentesis was created using a 20 gauge paracentesis blade.  Shugarcaine was injected into the anterior chamber.  Viscoelastic was injected into the anterior chamber.  A temporal clear-corneal main wound incision was created using a 2.41mmicrokeratome.  A continuous curvilinear capsulorrhexis was initiated using an irrigating cystitome and completed using capsulorrhexis forceps.  Hydrodissection and hydrodeliniation were performed.  Viscoelastic was injected into the anterior chamber.  A phacoemulsification handpiece and a chopper as a second instrument were used to remove the nucleus and epinucleus. The irrigation/aspiration handpiece was used to remove any remaining cortical  material.   The capsular bag was reinflated with viscoelastic, checked, and found to be intact.  The intraocular lens was inserted into the capsular bag and dialed into place using a Kuglen hook.  The irrigation/aspiration handpiece was used to remove any remaining viscoelastic.  The clear corneal wound and paracentesis wounds were then hydrated and checked with Weck-Cels to be watertight.  The lid-speculum and drape was removed, and the patient's face was cleaned with a wet and dry 4x4.  Maxitrol was instilled in the eye before a clear shield was taped over the eye. The patient was taken to the post-operative care unit in good condition, having tolerated the procedure well.  Post-Op Instructions: The [facility] post-operative evaluation and will receive all other orders and instructions.

## 2019-01-20 NOTE — H&P (Signed)
The H and P was reviewed and updated. The patient was examined.  No changes were found after exam.  The surgical eye was marked.  

## 2019-01-20 NOTE — Anesthesia Preprocedure Evaluation (Signed)
Anesthesia Evaluation    Airway Mallampati: II       Dental  (+) Upper Dentures   Pulmonary former smoker,    breath sounds clear to auscultation       Cardiovascular hypertension, + CAD   Rhythm:regular     Neuro/Psych CVA    GI/Hepatic GERD  ,  Endo/Other    Renal/GU      Musculoskeletal   Abdominal   Peds  Hematology   Anesthesia Other Findings   Reproductive/Obstetrics                             Anesthesia Physical Anesthesia Plan  ASA: III  Anesthesia Plan: MAC   Post-op Pain Management:    Induction:   PONV Risk Score and Plan:   Airway Management Planned:   Additional Equipment:   Intra-op Plan:   Post-operative Plan:   Informed Consent: I have reviewed the patients History and Physical, chart, labs and discussed the procedure including the risks, benefits and alternatives for the proposed anesthesia with the patient or authorized representative who has indicated his/her understanding and acceptance.       Plan Discussed with: Anesthesiologist  Anesthesia Plan Comments:         Anesthesia Quick Evaluation

## 2019-01-20 NOTE — Transfer of Care (Signed)
Immediate Anesthesia Transfer of Care Note  Patient: Jeff [name]  Procedure(s) Performed: CATARACT EXTRACTION PHACO AND INTRAOCULAR LENS PLACEMENT LEFT EYE (Left Eye)  Patient Location: Short Stay  Anesthesia Type:MAC  Level of Consciousness: awake  Airway & Oxygen Therapy: Patient Spontanous Breathing  Post-op Assessment: Post -op Vital signs reviewed and stable  Post vital signs: Reviewed  Last Vitals:  Vitals Value Taken Time  BP    Temp    Pulse    Resp    SpO2      Last Pain:  Vitals:   01/20/19 1026  TempSrc: Oral  PainSc: 0-No pain      Patients Stated Pain Goal: 8 ([id number])  Complications: No apparent anesthesia complications

## 2019-01-20 NOTE — Anesthesia Postprocedure Evaluation (Signed)
Anesthesia Post Note  Patient: Jeff [name]  Procedure(s) Performed: CATARACT EXTRACTION PHACO AND INTRAOCULAR LENS PLACEMENT LEFT EYE (Left Eye)  Patient location during evaluation: Short Stay Anesthesia Type: MAC Level of consciousness: awake and alert and oriented Pain management: pain level controlled Vital Signs Assessment: post-procedure vital signs reviewed and stable Respiratory status: spontaneous breathing Cardiovascular status: blood pressure returned to baseline and stable Postop Assessment: no apparent nausea or vomiting Anesthetic complications: no     Last Vitals:  Vitals:   01/20/19 1045 01/20/19 1100  BP: (!) 156/71 (!) 148/73  Resp:    Temp:    SpO2:      Last Pain:  Vitals:   01/20/19 1026  TempSrc: Oral  PainSc: 0-No pain                 Kelijah Towry

## 2019-01-20 NOTE — Discharge Instructions (Addendum)
PATIENT INSTRUCTIONS °POST-ANESTHESIA ° °IMMEDIATELY FOLLOWING SURGERY:  Do not drive or operate machinery for the first twenty four hours after surgery.  Do not make any important decisions for twenty four hours after surgery or while taking narcotic pain medications or sedatives.  If you develop intractable nausea and vomiting or a severe headache please notify your doctor immediately. ° °FOLLOW-UP:  Please make an appointment with your surgeon as instructed. You do not need to follow up with anesthesia unless specifically instructed to do so. ° °WOUND CARE INSTRUCTIONS (if applicable):  Keep a dry clean dressing on the anesthesia/puncture wound site if there is drainage.  Once the wound has quit draining you may leave it open to air.  Generally you should leave the bandage intact for twenty four hours unless there is drainage.  If the epidural site drains for more than 36-48 hours please call the anesthesia department. ° °QUESTIONS?:  Please feel free to call your physician or the hospital operator if you have any questions, and they will be happy to assist you.    ° ° °Please discharge patient when stable, will follow up today with Dr. Wrzosek at the [facility] office immediately following discharge.  Leave shield in place until visit.  All paperwork with discharge instructions will be given at the office. ° °

## 2019-01-21 ENCOUNTER — Encounter (HOSPITAL_COMMUNITY): Payer: Self-pay | Admitting: Ophthalmology

## 2019-02-05 ENCOUNTER — Other Ambulatory Visit (HOSPITAL_COMMUNITY): Payer: Medicare HMO

## 2019-02-06 ENCOUNTER — Other Ambulatory Visit (HOSPITAL_COMMUNITY): Payer: Medicare HMO

## 2019-03-24 DIAGNOSIS — R69 Illness, unspecified: Secondary | ICD-10-CM | POA: Diagnosis not present

## 2019-04-03 NOTE — H&P (Signed)
Surgical History & Physical  Patient Name: Jeff [name] DOB: 02-19-1946  Surgery: Cataract extraction with intraocular lens implant phacoemulsification; Right Eye  Surgeon: Baruch Goldmann MD Surgery Date:  04/18/2019 Pre-Op Date:  04/03/2019  HPI: A 38 Yr. old male patient 1. The patient complains of difficulty when viewing TV, reading closed caption, news scrolls on TV, which began 3 months ago. The right eye is affected. The episode is gradual. The condition's severity increased since last visit. Symptoms occur when the patient is driving, inside and outside. 2. The patient is returning after cataract post-op. The left eye is affected. Status post cataract post-op, which began 1 year ago: Since the last visit, the affected area is doing well. The patient's vision is improved. Patient is following medication instructions. HPI was performed by Baruch Goldmann .  Medical History: Cataracts Vitreous degeneration OU Cancer High Blood Pressure LDL Stroke  Review of Systems Negative Allergic/Immunologic Negative Cardiovascular Negative Constitutional Negative Gastrointestinal Negative Genitourinary Negative Ear, Nose, Mouth & Throat Negative Neurological Negative Respiratory  Social   Never smoked   Medication Aspirin, Montelukast, Metoprolol, Tuianterane, Simvastatin,   Sx/Procedures Phaco c IOL,  Prostate CA surgery, Right carotid endarterectomy,   Drug Allergies   NKDA  History & Physical: Heent:  Cataract, Right eye NECK: supple without bruits LUNGS: lungs clear to auscultation CV: regular rate and rhythm Abdomen: soft and non-tender  Impression & Plan: Assessment: 1.  COMBINED FORMS AGE RELATED CATARACT; Right Eye (H25.811) 2.  CATARACT EXTRACTION STATUS; Left Eye (Z98.42) 3.  Posterior Vitreous Detachment; Left Eye [phone])  Plan: 1.  Cataract accounts for the patient's decreased vision. This visual impairment is not correctable with a tolerable change in  glasses or contact lenses. Cataract surgery with an implantation of a new lens should significantly improve the visual and functional status of the patient. Discussed all risks, benefits, alternatives, and potential complications. Discussed the procedures and recovery. Patient desires to have surgery. A-scan ordered and performed today for intra-ocular lens calculations. The surgery will be performed in order to improve vision for driving, reading, and for eye examinations. Recommend phacoemulsification with intra-ocular lens. Right Eye. Surgery required to correct imbalance of vision. Dilates well - shugarcaine by protocol. 2.  1 week after cataract surgery. Doing well with improved vision and normal eye pressure. Call with any problems or concerns. Stop Vigamox. Continue Ilevro 1 drop 1x/day for 3 more weeks. Continue Pred Acetate 1 drop 2x/day for 3 more weeks. 3.  No retinal tear or retinal detachment. I told the patient to contact me ASAP for new onset/worsening of floaters, flashes, or vision loss.

## 2019-04-10 DIAGNOSIS — Z7689 Persons encountering health services in other specified circumstances: Secondary | ICD-10-CM | POA: Diagnosis not present

## 2019-04-10 DIAGNOSIS — E7849 Other hyperlipidemia: Secondary | ICD-10-CM | POA: Diagnosis not present

## 2019-04-10 DIAGNOSIS — I10 Essential (primary) hypertension: Secondary | ICD-10-CM | POA: Diagnosis not present

## 2019-04-10 DIAGNOSIS — I2510 Atherosclerotic heart disease of native coronary artery without angina pectoris: Secondary | ICD-10-CM | POA: Diagnosis not present

## 2019-04-10 DIAGNOSIS — Z0000 Encounter for general adult medical examination without abnormal findings: Secondary | ICD-10-CM | POA: Diagnosis not present

## 2019-04-10 DIAGNOSIS — I739 Peripheral vascular disease, unspecified: Secondary | ICD-10-CM | POA: Diagnosis not present

## 2019-04-10 DIAGNOSIS — E663 Overweight: Secondary | ICD-10-CM | POA: Diagnosis not present

## 2019-04-10 DIAGNOSIS — E119 Type 2 diabetes mellitus without complications: Secondary | ICD-10-CM | POA: Diagnosis not present

## 2019-04-10 DIAGNOSIS — Z6828 Body mass index (BMI) 28.0-28.9, adult: Secondary | ICD-10-CM | POA: Diagnosis not present

## 2019-04-10 DIAGNOSIS — Z1389 Encounter for screening for other disorder: Secondary | ICD-10-CM | POA: Diagnosis not present

## 2019-04-11 DIAGNOSIS — H25811 Combined forms of age-related cataract, right eye: Secondary | ICD-10-CM | POA: Diagnosis not present

## 2019-04-14 ENCOUNTER — Other Ambulatory Visit: Payer: Self-pay

## 2019-04-14 ENCOUNTER — Encounter (HOSPITAL_COMMUNITY)
Admission: RE | Admit: 2019-04-14 | Discharge: 2019-04-14 | Disposition: A | Discharge status: Home / Self Care | Payer: Medicare HMO | Source: Ambulatory Visit | Attending: Ophthalmology | Admitting: Ophthalmology

## 2019-04-15 ENCOUNTER — Other Ambulatory Visit: Payer: Self-pay

## 2019-04-15 ENCOUNTER — Other Ambulatory Visit (HOSPITAL_COMMUNITY)
Admission: RE | Admit: 2019-04-15 | Discharge: 2019-04-15 | Disposition: A | Discharge status: Home / Self Care | Payer: Medicare HMO | Source: Ambulatory Visit | Attending: Ophthalmology | Admitting: Ophthalmology

## 2019-04-15 DIAGNOSIS — Z20828 Contact with and (suspected) exposure to other viral communicable diseases: Secondary | ICD-10-CM | POA: Insufficient documentation

## 2019-04-15 DIAGNOSIS — Z01812 Encounter for preprocedural laboratory examination: Secondary | ICD-10-CM | POA: Insufficient documentation

## 2019-04-15 LAB — SARS CORONAVIRUS 2 (TAT 6-24 HRS): SARS Coronavirus 2: NEGATIVE

## 2019-04-18 ENCOUNTER — Ambulatory Visit (HOSPITAL_COMMUNITY)
Admission: RE | Admit: 2019-04-18 | Discharge: 2019-04-18 | Disposition: A | Discharge status: Home / Self Care | Payer: Medicare HMO | Attending: Ophthalmology | Admitting: Ophthalmology

## 2019-04-18 ENCOUNTER — Encounter (HOSPITAL_COMMUNITY): Payer: Self-pay

## 2019-04-18 ENCOUNTER — Other Ambulatory Visit: Payer: Self-pay

## 2019-04-18 ENCOUNTER — Ambulatory Visit (HOSPITAL_COMMUNITY): Payer: Medicare HMO | Admitting: Anesthesiology

## 2019-04-18 ENCOUNTER — Encounter (HOSPITAL_COMMUNITY)
Admission: RE | Disposition: A | Discharge status: Home / Self Care | Payer: Self-pay | Source: Home / Self Care | Attending: Ophthalmology

## 2019-04-18 DIAGNOSIS — H25811 Combined forms of age-related cataract, right eye: Secondary | ICD-10-CM | POA: Insufficient documentation

## 2019-04-18 DIAGNOSIS — I10 Essential (primary) hypertension: Secondary | ICD-10-CM | POA: Diagnosis not present

## 2019-04-18 DIAGNOSIS — Z7982 Long term (current) use of aspirin: Secondary | ICD-10-CM | POA: Diagnosis not present

## 2019-04-18 DIAGNOSIS — Z8673 Personal history of transient ischemic attack (TIA), and cerebral infarction without residual deficits: Secondary | ICD-10-CM | POA: Diagnosis not present

## 2019-04-18 DIAGNOSIS — Z79899 Other long term (current) drug therapy: Secondary | ICD-10-CM | POA: Diagnosis not present

## 2019-04-18 DIAGNOSIS — Z87891 Personal history of nicotine dependence: Secondary | ICD-10-CM | POA: Diagnosis not present

## 2019-04-18 DIAGNOSIS — E7800 Pure hypercholesterolemia, unspecified: Secondary | ICD-10-CM | POA: Diagnosis not present

## 2019-04-18 HISTORY — PX: CATARACT EXTRACTION W/PHACO: SHX586

## 2019-04-18 SURGERY — PHACOEMULSIFICATION, CATARACT, WITH IOL INSERTION
Anesthesia: Monitor Anesthesia Care | Site: Eye | Laterality: Right

## 2019-04-18 MED ORDER — POVIDONE-IODINE 5 % OP SOLN
OPHTHALMIC | Status: DC | PRN
  Administered 2019-04-18: 1 via OPHTHALMIC

## 2019-04-18 MED ORDER — PHENYLEPHRINE HCL 2.5 % OP SOLN
1.0000 [drp] | OPHTHALMIC | Status: AC | PRN
  Administered 2019-04-18 (×3): 1 [drp] via OPHTHALMIC

## 2019-04-18 MED ORDER — NEOMYCIN-POLYMYXIN-DEXAMETH 3.5-10000-0.1 OP SUSP
OPHTHALMIC | Status: DC | PRN
  Administered 2019-04-18: 1 [drp] via OPHTHALMIC

## 2019-04-18 MED ORDER — BSS IO SOLN
INTRAOCULAR | Status: DC | PRN
  Administered 2019-04-18: 15 mL

## 2019-04-18 MED ORDER — TETRACAINE HCL 0.5 % OP SOLN
1.0000 [drp] | OPHTHALMIC | Status: AC | PRN
  Administered 2019-04-18 (×3): 1 [drp] via OPHTHALMIC

## 2019-04-18 MED ORDER — SODIUM HYALURONATE 23 MG/ML IO SOLN
INTRAOCULAR | Status: DC | PRN
  Administered 2019-04-18: 0.6 mL via INTRAOCULAR

## 2019-04-18 MED ORDER — LIDOCAINE HCL 3.5 % OP GEL
1.0000 "application " | Freq: Once | OPHTHALMIC | Status: AC
  Administered 2019-04-18: 1 via OPHTHALMIC

## 2019-04-18 MED ORDER — LIDOCAINE HCL (PF) 1 % IJ SOLN
INTRAOCULAR | Status: DC | PRN
  Administered 2019-04-18: 1 mL via OPHTHALMIC

## 2019-04-18 MED ORDER — CYCLOPENTOLATE-PHENYLEPHRINE 0.2-1 % OP SOLN
1.0000 [drp] | OPHTHALMIC | Status: AC | PRN
  Administered 2019-04-18 (×3): 1 [drp] via OPHTHALMIC

## 2019-04-18 MED ORDER — PROVISC 10 MG/ML IO SOLN
INTRAOCULAR | Status: DC | PRN
  Administered 2019-04-18: 0.85 mL via INTRAOCULAR

## 2019-04-18 MED ORDER — EPINEPHRINE PF 1 MG/ML IJ SOLN
INTRAOCULAR | Status: DC | PRN
  Administered 2019-04-18: 500 mL

## 2019-04-18 SURGICAL SUPPLY — 12 items
CLOTH BEACON ORANGE TIMEOUT ST (SAFETY) ×2 IMPLANT
EYE SHIELD UNIVERSAL CLEAR (GAUZE/BANDAGES/DRESSINGS) ×2 IMPLANT
GLOVE BIOGEL PI IND STRL 7.0 (GLOVE) ×2 IMPLANT
GLOVE BIOGEL PI INDICATOR 7.0 (GLOVE) ×2
LENS ALC ACRYL/TECN (Ophthalmic Related) ×2 IMPLANT
NEEDLE HYPO 18GX1.5 BLUNT FILL (NEEDLE) ×2 IMPLANT
PAD ARMBOARD 7.5X6 YLW CONV (MISCELLANEOUS) ×2 IMPLANT
SYR TB 1ML LL NO SAFETY (SYRINGE) ×2 IMPLANT
TAPE SURG TRANSPORE 1 IN (GAUZE/BANDAGES/DRESSINGS) ×1 IMPLANT
TAPE SURGICAL TRANSPORE 1 IN (GAUZE/BANDAGES/DRESSINGS) ×1
VISCOELASTIC ADDITIONAL (OPHTHALMIC RELATED) ×2 IMPLANT
WATER STERILE IRR 250ML POUR (IV SOLUTION) ×2 IMPLANT

## 2019-04-18 NOTE — Interval H&P Note (Signed)
History and Physical Interval Note: The H and P was reviewed and updated. The patient was examined.  No changes were found after exam.  The surgical eye was marked.  04/18/2019 7:55 AM  [place]  has presented today for surgery, with the diagnosis of nuclear cataract right eye.  The various methods of treatment have been discussed with the patient and family. After consideration of risks, benefits and other options for treatment, the patient has consented to  Procedure(s) with comments: CATARACT EXTRACTION PHACO AND INTRAOCULAR LENS PLACEMENT RIGHT EYE (Right) - right as a surgical intervention.  The patient's history has been reviewed, patient examined, no change in status, stable for surgery.  I have reviewed the patient's chart and labs.  Questions were answered to the patient's satisfaction.     Baruch Goldmann

## 2019-04-18 NOTE — Transfer of Care (Signed)
Immediate Anesthesia Transfer of Care Note  Patient: Jeff [name]  Procedure(s) Performed: CATARACT EXTRACTION PHACO AND INTRAOCULAR LENS PLACEMENT RIGHT EYE (Right Eye)  Patient Location: Short Stay  Anesthesia Type:MAC  Level of Consciousness: awake  Airway & Oxygen Therapy: Patient Spontanous Breathing  Post-op Assessment: Report given to RN and Post -op Vital signs reviewed and stable  Post vital signs: Reviewed and stable  Last Vitals:  Vitals Value Taken Time  BP    Temp    Pulse    Resp    SpO2      Last Pain:  Vitals:   04/18/19 0648  TempSrc: Oral  PainSc:          Complications: No apparent anesthesia complications

## 2019-04-18 NOTE — Op Note (Signed)
Date of procedure: 04/18/19  Pre-operative diagnosis: Visually significant age-related cataract, Right Eye (H25.811)  Post-operative diagnosis: Visually significant age-related cataract, Right Eye  Procedure: Removal of cataract via phacoemulsification and insertion of intra-ocular lens Wynetta Emery and [employer] PCB00  +21.5D into the capsular bag of the Right Eye  Attending surgeon: Gerda Diss. Raguel Kosloski, MD, MA  Anesthesia: MAC, Topical Akten  Complications: None  Estimated Blood Loss: <63m (minimal)  Specimens: None  Implants: As above  Indications:  Visually significant age-related cataract, Right Eye  Procedure:  The patient was seen and identified in the pre-operative area. The operative eye was identified and dilated.  The operative eye was marked.  Topical anesthesia was administered to the operative eye.     The patient was then to the operative suite and placed in the supine position.  A timeout was performed confirming the patient, procedure to be performed, and all other relevant information.   The patient's face was prepped and draped in the usual fashion for intra-ocular surgery.  A lid speculum was placed into the operative eye and the surgical microscope moved into place and focused.  A superotemporal paracentesis was created using a 20 gauge paracentesis blade.  Shugarcaine was injected into the anterior chamber.  Viscoelastic was injected into the anterior chamber.  A temporal clear-corneal main wound incision was created using a 2.411mmicrokeratome.  A continuous curvilinear capsulorrhexis was initiated using an irrigating cystitome and completed using capsulorrhexis forceps.  Hydrodissection and hydrodeliniation were performed.  Viscoelastic was injected into the anterior chamber.  A phacoemulsification handpiece and a chopper as a second instrument were used to remove the nucleus and epinucleus. The irrigation/aspiration handpiece was used to remove any remaining cortical  material.   The capsular bag was reinflated with viscoelastic, checked, and found to be intact.  The intraocular lens was inserted into the capsular bag.  The irrigation/aspiration handpiece was used to remove any remaining viscoelastic.  The clear corneal wound and paracentesis wounds were then hydrated and checked with Weck-Cels to be watertight.  The lid-speculum and drape was removed, and the patient's face was cleaned with a wet and dry 4x4.  Maxitrol was instilled in the eye before a clear shield was taped over the eye. The patient was taken to the post-operative care unit in good condition, having tolerated the procedure well.  Post-Op Instructions: [facility] and will receive all other orders and instructions.

## 2019-04-18 NOTE — Discharge Instructions (Addendum)
Please discharge patient when stable, will follow up today with Dr. Marisa Hua at the [facility] office after 9AM this morning.  Leave shield in place until visit.  All paperwork with discharge instructions will be given at the office.

## 2019-04-18 NOTE — Anesthesia Preprocedure Evaluation (Signed)
Anesthesia Evaluation  Patient identified by MRN, date of birth, ID band Patient awake    Reviewed: Allergy & Precautions, NPO status , Patient's Chart, lab work & pertinent test results, reviewed documented beta blocker date and time   Airway Mallampati: III  TM Distance: >3 FB Neck ROM: Full    Dental  (+) Upper Dentures, Missing, Dental Advisory Given, Chipped   Pulmonary former smoker,    Pulmonary exam normal breath sounds clear to auscultation       Cardiovascular hypertension (metoprolol on 04/17/19), Pt. on medications and Pt. on home beta blockers + CAD and + Peripheral Vascular Disease (right carotid endarterectomy)  Normal cardiovascular exam Rhythm:Regular Rate:Normal     Neuro/Psych CVA    GI/Hepatic Neg liver ROS, GERD  Medicated,  Endo/Other  negative endocrine ROS  Renal/GU negative Renal ROS     Musculoskeletal   Abdominal   Peds  Hematology   Anesthesia Other Findings   Reproductive/Obstetrics                             Anesthesia Physical Anesthesia Plan  ASA: III  Anesthesia Plan: MAC   Post-op Pain Management:    Induction:   PONV Risk Score and Plan: 0  Airway Management Planned:   Additional Equipment:   Intra-op Plan:   Post-operative Plan:   Informed Consent: I have reviewed the patients History and Physical, chart, labs and discussed the procedure including the risks, benefits and alternatives for the proposed anesthesia with the patient or authorized representative who has indicated his/her understanding and acceptance.     Dental advisory given  Plan Discussed with: CRNA  Anesthesia Plan Comments:         Anesthesia Quick Evaluation

## 2019-04-18 NOTE — Anesthesia Postprocedure Evaluation (Signed)
Anesthesia Post Note  Patient: Jeff [name]  Procedure(s) Performed: CATARACT EXTRACTION PHACO AND INTRAOCULAR LENS PLACEMENT RIGHT EYE (Right Eye)  Patient location during evaluation: Short Stay Anesthesia Type: MAC Level of consciousness: awake Pain management: pain level controlled Vital Signs Assessment: post-procedure vital signs reviewed and stable Respiratory status: spontaneous breathing Cardiovascular status: stable Postop Assessment: no apparent nausea or vomiting and able to ambulate Anesthetic complications: no     Last Vitals:  Vitals:   04/18/19 0643 04/18/19 0648  BP: (!) 162/75   Pulse:    Resp:    Temp:  36.6 C  SpO2:      Last Pain:  Vitals:   04/18/19 0648  TempSrc: Oral  PainSc:                  Jabier Mutton

## 2019-04-21 ENCOUNTER — Encounter (HOSPITAL_COMMUNITY): Payer: Self-pay | Admitting: Ophthalmology

## 2019-05-07 DIAGNOSIS — R69 Illness, unspecified: Secondary | ICD-10-CM | POA: Diagnosis not present

## 2019-08-21 DIAGNOSIS — Z0100 Encounter for examination of eyes and vision without abnormal findings: Secondary | ICD-10-CM | POA: Diagnosis not present

## 2019-08-21 DIAGNOSIS — H5200 Hypermetropia, unspecified eye: Secondary | ICD-10-CM | POA: Diagnosis not present

## 2019-12-31 DIAGNOSIS — R69 Illness, unspecified: Secondary | ICD-10-CM | POA: Diagnosis not present

## 2020-03-18 DIAGNOSIS — E119 Type 2 diabetes mellitus without complications: Secondary | ICD-10-CM | POA: Diagnosis not present

## 2020-03-18 DIAGNOSIS — I708 Atherosclerosis of other arteries: Secondary | ICD-10-CM | POA: Diagnosis not present

## 2020-03-18 DIAGNOSIS — E7849 Other hyperlipidemia: Secondary | ICD-10-CM | POA: Diagnosis not present

## 2020-03-18 DIAGNOSIS — Z0000 Encounter for general adult medical examination without abnormal findings: Secondary | ICD-10-CM | POA: Diagnosis not present

## 2020-03-18 DIAGNOSIS — C61 Malignant neoplasm of prostate: Secondary | ICD-10-CM | POA: Diagnosis not present

## 2020-03-18 DIAGNOSIS — Z23 Encounter for immunization: Secondary | ICD-10-CM | POA: Diagnosis not present

## 2020-03-18 DIAGNOSIS — I6521 Occlusion and stenosis of right carotid artery: Secondary | ICD-10-CM | POA: Diagnosis not present

## 2020-03-18 DIAGNOSIS — Z6828 Body mass index (BMI) 28.0-28.9, adult: Secondary | ICD-10-CM | POA: Diagnosis not present

## 2020-03-18 DIAGNOSIS — E663 Overweight: Secondary | ICD-10-CM | POA: Diagnosis not present

## 2020-03-18 DIAGNOSIS — I10 Essential (primary) hypertension: Secondary | ICD-10-CM | POA: Diagnosis not present

## 2020-03-19 DIAGNOSIS — C61 Malignant neoplasm of prostate: Secondary | ICD-10-CM | POA: Diagnosis not present

## 2020-03-19 DIAGNOSIS — Z23 Encounter for immunization: Secondary | ICD-10-CM | POA: Diagnosis not present

## 2020-03-19 DIAGNOSIS — E782 Mixed hyperlipidemia: Secondary | ICD-10-CM | POA: Diagnosis not present

## 2020-03-19 DIAGNOSIS — E039 Hypothyroidism, unspecified: Secondary | ICD-10-CM | POA: Diagnosis not present

## 2020-03-19 DIAGNOSIS — Z1389 Encounter for screening for other disorder: Secondary | ICD-10-CM | POA: Diagnosis not present

## 2020-03-19 DIAGNOSIS — Z0001 Encounter for general adult medical examination with abnormal findings: Secondary | ICD-10-CM | POA: Diagnosis not present

## 2020-03-24 DIAGNOSIS — Z23 Encounter for immunization: Secondary | ICD-10-CM | POA: Diagnosis not present

## 2020-03-24 DIAGNOSIS — I10 Essential (primary) hypertension: Secondary | ICD-10-CM | POA: Diagnosis not present

## 2021-01-19 DIAGNOSIS — Z008 Encounter for other general examination: Secondary | ICD-10-CM | POA: Diagnosis not present

## 2021-01-19 DIAGNOSIS — I2510 Atherosclerotic heart disease of native coronary artery without angina pectoris: Secondary | ICD-10-CM | POA: Diagnosis not present

## 2021-01-19 DIAGNOSIS — Z803 Family history of malignant neoplasm of breast: Secondary | ICD-10-CM | POA: Diagnosis not present

## 2021-01-19 DIAGNOSIS — Z8249 Family history of ischemic heart disease and other diseases of the circulatory system: Secondary | ICD-10-CM | POA: Diagnosis not present

## 2021-01-19 DIAGNOSIS — E663 Overweight: Secondary | ICD-10-CM | POA: Diagnosis not present

## 2021-01-19 DIAGNOSIS — Z823 Family history of stroke: Secondary | ICD-10-CM | POA: Diagnosis not present

## 2021-01-19 DIAGNOSIS — E785 Hyperlipidemia, unspecified: Secondary | ICD-10-CM | POA: Diagnosis not present

## 2021-01-19 DIAGNOSIS — J45909 Unspecified asthma, uncomplicated: Secondary | ICD-10-CM | POA: Diagnosis not present

## 2021-01-19 DIAGNOSIS — N529 Male erectile dysfunction, unspecified: Secondary | ICD-10-CM | POA: Diagnosis not present

## 2021-01-19 DIAGNOSIS — Z6826 Body mass index (BMI) 26.0-26.9, adult: Secondary | ICD-10-CM | POA: Diagnosis not present

## 2021-01-19 DIAGNOSIS — I10 Essential (primary) hypertension: Secondary | ICD-10-CM | POA: Diagnosis not present

## 2021-02-16 DIAGNOSIS — H5200 Hypermetropia, unspecified eye: Secondary | ICD-10-CM | POA: Diagnosis not present

## 2021-02-16 DIAGNOSIS — Z0100 Encounter for examination of eyes and vision without abnormal findings: Secondary | ICD-10-CM | POA: Diagnosis not present

## 2021-03-29 DIAGNOSIS — Z6828 Body mass index (BMI) 28.0-28.9, adult: Secondary | ICD-10-CM | POA: Diagnosis not present

## 2021-03-29 DIAGNOSIS — R7309 Other abnormal glucose: Secondary | ICD-10-CM | POA: Diagnosis not present

## 2021-03-29 DIAGNOSIS — Z1389 Encounter for screening for other disorder: Secondary | ICD-10-CM | POA: Diagnosis not present

## 2021-03-29 DIAGNOSIS — I2510 Atherosclerotic heart disease of native coronary artery without angina pectoris: Secondary | ICD-10-CM | POA: Diagnosis not present

## 2021-03-29 DIAGNOSIS — I708 Atherosclerosis of other arteries: Secondary | ICD-10-CM | POA: Diagnosis not present

## 2021-03-29 DIAGNOSIS — Z1331 Encounter for screening for depression: Secondary | ICD-10-CM | POA: Diagnosis not present

## 2021-03-29 DIAGNOSIS — E119 Type 2 diabetes mellitus without complications: Secondary | ICD-10-CM | POA: Diagnosis not present

## 2021-03-29 DIAGNOSIS — I10 Essential (primary) hypertension: Secondary | ICD-10-CM | POA: Diagnosis not present

## 2021-03-29 DIAGNOSIS — E782 Mixed hyperlipidemia: Secondary | ICD-10-CM | POA: Diagnosis not present

## 2021-03-29 DIAGNOSIS — I6521 Occlusion and stenosis of right carotid artery: Secondary | ICD-10-CM | POA: Diagnosis not present

## 2021-03-29 DIAGNOSIS — E663 Overweight: Secondary | ICD-10-CM | POA: Diagnosis not present

## 2021-03-29 DIAGNOSIS — Z125 Encounter for screening for malignant neoplasm of prostate: Secondary | ICD-10-CM | POA: Diagnosis not present

## 2021-03-29 DIAGNOSIS — Z0000 Encounter for general adult medical examination without abnormal findings: Secondary | ICD-10-CM | POA: Diagnosis not present

## 2021-05-04 ENCOUNTER — Encounter: Payer: Self-pay | Admitting: Internal Medicine

## 2021-08-10 ENCOUNTER — Encounter: Payer: Self-pay | Admitting: Internal Medicine

## 2021-09-23 ENCOUNTER — Ambulatory Visit: Payer: Medicare HMO | Admitting: Gastroenterology

## 2021-11-09 DIAGNOSIS — Z6826 Body mass index (BMI) 26.0-26.9, adult: Secondary | ICD-10-CM | POA: Diagnosis not present

## 2021-11-09 DIAGNOSIS — I739 Peripheral vascular disease, unspecified: Secondary | ICD-10-CM | POA: Diagnosis not present

## 2021-11-09 DIAGNOSIS — E785 Hyperlipidemia, unspecified: Secondary | ICD-10-CM | POA: Diagnosis not present

## 2021-11-09 DIAGNOSIS — Z8673 Personal history of transient ischemic attack (TIA), and cerebral infarction without residual deficits: Secondary | ICD-10-CM | POA: Diagnosis not present

## 2021-11-09 DIAGNOSIS — Z87891 Personal history of nicotine dependence: Secondary | ICD-10-CM | POA: Diagnosis not present

## 2021-11-09 DIAGNOSIS — E663 Overweight: Secondary | ICD-10-CM | POA: Diagnosis not present

## 2021-11-09 DIAGNOSIS — I10 Essential (primary) hypertension: Secondary | ICD-10-CM | POA: Diagnosis not present

## 2021-11-09 DIAGNOSIS — Z008 Encounter for other general examination: Secondary | ICD-10-CM | POA: Diagnosis not present

## 2021-11-09 DIAGNOSIS — Z8546 Personal history of malignant neoplasm of prostate: Secondary | ICD-10-CM | POA: Diagnosis not present

## 2021-11-09 DIAGNOSIS — Z803 Family history of malignant neoplasm of breast: Secondary | ICD-10-CM | POA: Diagnosis not present

## 2021-11-09 DIAGNOSIS — R69 Illness, unspecified: Secondary | ICD-10-CM | POA: Diagnosis not present

## 2021-11-09 DIAGNOSIS — Z7982 Long term (current) use of aspirin: Secondary | ICD-10-CM | POA: Diagnosis not present

## 2021-11-09 DIAGNOSIS — N529 Male erectile dysfunction, unspecified: Secondary | ICD-10-CM | POA: Diagnosis not present

## 2021-12-03 NOTE — Progress Notes (Signed)
? ?Referring Provider: Sharilyn Sites, MD ?Primary Care Physician:  Sharilyn Sites, MD ?Primary Gastroenterologist:  Dr. Gala Romney ? ?Chief Complaint  ?Patient presents with  ? Colonoscopy  ?  Triage for colonoscopy  ? ? ?HPI:   ?Jeff [name] is a 76 y.o. male presenting today to discuss scheduling surveillance colonoscopy. Last colonoscopy in 2007 with markedly inflamed sigmoid colon with rectal sparing more proximal  colon, cecal ulcers, markedly abnormal terminal ileum, status post biopsy of sigmoid colon and terminal ileum mucosa. Biopsy showed active colitis, nonspecific. Prior colonoscopy in 2004 with diminutive polyps in the rectum (destroyed with the tip of the snare unit), sessile polyps. Pathology with inflammatory polyp and benign fragment with increased inflammation.  ? ?We last saw patient in 2014. He was asymptomatic from a GI standpoint and was scheduled for a colonoscopy but cancelled as he did not have a driver.  ? ?Today:  ?Reports is doing well overall.  Denies abdominal pain, constipation, diarrhea, BRBPR, melena, unintentional weight loss, nausea, vomiting, reflux symptoms, dysphagia. ? ?Past Medical History:  ?Diagnosis Date  ? Cancer [facility])   ? Carotid artery disease ([place])   ? Coronary artery disease   ? Diabetes ([place])   ? diet controlled  ? Essential hypertension   ? GERD (gastroesophageal reflux disease)   ? History of stroke 2011  ? Mixed hyperlipidemia   ? Prostate cancer [facility]) 2004  ? Stroke [facility])   ? no deficits  ? ? ?Past Surgical History:  ?Procedure Laterality Date  ? carotid duplex bilateral  01/02/12  ? CAROTID ENDARTERECTOMY Right 2011  ? CATARACT EXTRACTION W/PHACO Left 01/20/2019  ? Procedure: CATARACT EXTRACTION PHACO AND INTRAOCULAR LENS PLACEMENT LEFT EYE;  Surgeon: Baruch Goldmann, MD;  Location: [facility];  Service: Ophthalmology;  Laterality: Left;  CDE: 10.2  ? CATARACT EXTRACTION W/PHACO Right 04/18/2019  ? Procedure: CATARACT EXTRACTION PHACO AND INTRAOCULAR LENS PLACEMENT  RIGHT EYE;  Surgeon: Baruch Goldmann, MD;  Location: [facility];  Service: Ophthalmology;  Laterality: Right;  right  ? COLONOSCOPY  03/12/2006  ? RMR: Normal rectum/ Markedly inflamed sigmoid colon with rectal sparing more proximal  colon.  The level of the cecum appeared normal, there were some small   cecal ulcers, markedly abnormal terminal ileum, status post biopsy of sigmoid colon and terminal ileum mucosa. Biopsy showed active colitis, nonspecific there  ? COLONOSCOPY   02/25/2003  ? BMW:[id number] polyps in the rectum (destroyed with the tip of the snare unit) not described above.  Otherwise normal rectum/. Left-sided diverticula/Sessile polyps as described above in the sigmoid colon at 25 and 35 sm.. polyps benign/inflammatory  ? myocardial perfusion  02/14/10  ? PROSTATE SURGERY  2004  ? TRANSTHORACIC ECHOCARDIOGRAM    ? ? ?Current Outpatient Medications  ?Medication Sig Dispense Refill  ? Acetaminophen 500 MG capsule Take by mouth.    ? amLODipine-atorvastatin (CADUET) 5-10 MG tablet Take 1 tablet by mouth daily.    ? aspirin EC 325 MG tablet Take 325 mg by mouth every morning.     ? b complex-C-folic acid 1 MG capsule Take 1 capsule by mouth daily.    ? cholecalciferol (VITAMIN D3) 25 MCG (1000 UT) tablet Take 1,000 Units by mouth daily.    ? losartan (COZAAR) 100 MG tablet Take 100 mg by mouth daily.     ? metoprolol succinate (TOPROL-XL) 25 MG 24 hr tablet Take 25 mg by mouth daily.    ? montelukast (SINGULAIR) 10 MG tablet Take 10 mg by mouth  at bedtime.    ? simvastatin (ZOCOR) 40 MG tablet Take 40 mg by mouth every evening.    ? ?No current facility-administered medications for this visit.  ? ? ?Allergies as of 12/05/2021  ? (No Known Allergies)  ? ? ?Family History  ?Problem Relation Age of Onset  ? CAD Mother   ? Stroke Mother   ? CAD Brother   ?     Age 69s  ? Heart disease Brother   ? CAD Sister   ?     Age 67s  ? Stroke Sister   ? Cancer Father   ? Stroke Maternal Grandmother   ? Cancer Paternal  Grandfather   ? Colon cancer Neg Hx   ? ? ?Social History  ? ?Socioeconomic History  ? Marital status: Single  ?  Spouse name: Not on file  ? Number of children: Not on file  ? Years of education: Not on file  ? Highest education level: Not on file  ?Occupational History  ? Not on file  ?Tobacco Use  ? Smoking status: Former  ?  Packs/day: 0.25  ?  Years: 15.00  ?  Pack years: 3.75  ?  Types: Cigarettes  ?  Quit date: 01/15/2010  ?  Years since quitting: 11.8  ? Smokeless tobacco: Former  ?Substance and Sexual Activity  ? Alcohol use: Not Currently  ? Drug use: No  ? Sexual activity: Not on file  ?Other Topics Concern  ? Not on file  ?Social History Narrative  ? Not on file  ? ?Social Determinants of Health  ? ?Financial Resource Strain: Not on file  ?Food Insecurity: Not on file  ?Transportation Needs: Not on file  ?Physical Activity: Not on file  ?Stress: Not on file  ?Social Connections: Not on file  ?Intimate Partner Violence: Not on file  ? ? ?Review of Systems: ?Gen: Denies any fever, chills, cold or flu like symptoms, pre-syncope, or syncope.  ?CV: Denies chest pain, heart palpitations. ?Resp: Denies shortness of breath or cough.  ?GI: See HPI ?GU : Denies urinary burning, urinary frequency, urinary hesitancy ?MS: Denies joint pain. ?Derm: Denies rash. ?Psych: Denies depression, anxiety. ?Heme: See HPI ? ?Physical Exam: ?BP 140/60   Pulse 78   Temp (!) 97.3 ?F (36.3 ?C)   Ht 5' 11"  (1.803 m)   Wt 198 lb 3.2 oz (89.9 kg)   BMI 27.64 kg/m?  ?General:   Alert and oriented. Pleasant and cooperative. Well-nourished and well-developed.  ?Head:  Normocephalic and atraumatic. ?Eyes:  Without icterus, sclera clear and conjunctiva pink.  ?Ears:  Normal auditory acuity. ?Lungs:  Clear to auscultation bilaterally. No wheezes, rales, or rhonchi. No distress.  ?Heart:  S1, S2 present without murmurs appreciated.  ?Abdomen:  +BS, soft, non-tender and non-distended. No HSM noted. No guarding or rebound. No masses  appreciated.  ?Rectal:  Deferred  ?Msk:  Symmetrical without gross deformities. Normal posture. ?Extremities:  Without edema. ?Neurologic:  Alert and  oriented x4;  grossly normal neurologically. ?Skin:  Intact without significant lesions or rashes. ?Psych:  Normal mood and affect. ? ?Labs: ?03/29/2021 (included in referral) ?CBC: WBC 5.5, hemoglobin 12.7 (L), hematocrit 38.3, MCV 89, MCH 29.5, MCHC 33.2, platelets 255. ?CMP: Glucose 117, creatinine 0.93, sodium 137, potassium 4.4, chloride 99, total calcium 9.6, total protein 7.4, albumin 4.4, total bilirubin 0.4, alk phos 90, AST 18, ALT 20. ?Lipid panel: Total cholesterol 100, triglycerides 114, HDL 29 (L), LDL 50. ?PSA: 0.6 ?TSH: 2.740 ?Hemoglobin A1c:  6.4 ? ?Assessment:  ?76 year old male with history of diet controlled diabetes, CAD, carotid artery disease, diastolic dysfunction, stroke, HTN, HLD, prostate cancer, GERD, colon polyps, presenting today to discuss scheduling surveillance colonoscopy. Last colonoscopy in 2007 with markedly inflamed sigmoid colon with rectal sparing more proximal  colon, cecal ulcers, markedly abnormal terminal ileum, status post biopsy of sigmoid colon and terminal ileum mucosa. Biopsy showed active colitis, nonspecific. Prior colonoscopy in 2004 with diminutive polyps in the rectum (destroyed with the tip of the snare unit), sessile polyps. Pathology with inflammatory polyp and benign fragment with increased inflammation.  Clinically, he is doing well without any significant GI symptoms or alarm symptoms.  Per review of labs included in referral today, hemoglobin was slightly low at 12.7 in August 2022 with normocytic indices.  Denies overt GI bleeding.  Unclear if this was a minor fluctuation in his labs versus development of true anemia.  We will update CBC today and plan to proceed with colonoscopy.  If evidence of anemia on repeat labs, will update iron panel and consider EGD iron deficient. ? ? ?Plan:  ?Proceed with  colonoscopy with propofol by Dr. Gala Romney in near future. The risks, benefits, and alternatives have been discussed with the patient in detail. The patient states understanding and desires to proceed. ?ASA 3 ?CBC.  ?If evidence of

## 2021-12-05 ENCOUNTER — Ambulatory Visit (INDEPENDENT_AMBULATORY_CARE_PROVIDER_SITE_OTHER): Payer: Medicare HMO | Admitting: Gastroenterology

## 2021-12-05 ENCOUNTER — Encounter: Payer: Self-pay | Admitting: Gastroenterology

## 2021-12-05 VITALS — BP 140/60 | HR 78 | Temp 97.3°F | Ht 71.0 in | Wt 198.2 lb

## 2021-12-05 DIAGNOSIS — Z860100 Personal history of colon polyps, unspecified: Secondary | ICD-10-CM

## 2021-12-05 DIAGNOSIS — D649 Anemia, unspecified: Secondary | ICD-10-CM | POA: Insufficient documentation

## 2021-12-05 NOTE — Patient Instructions (Signed)
Please have blood work completed at [facility]. ? ?We will arrange you to have a colonoscopy in the near future with Dr. Gala Romney. ? ?We will follow-up with you in the office as needed.  Do not hesitate to call if you have any new GI concerns. ? ?It was very nice meeting you today! ? ?Aliene Altes, PA-C ?[facility] ? ?

## 2021-12-07 DIAGNOSIS — D649 Anemia, unspecified: Secondary | ICD-10-CM | POA: Diagnosis not present

## 2021-12-09 ENCOUNTER — Telehealth: Payer: Self-pay

## 2021-12-09 LAB — CBC WITH DIFFERENTIAL/PLATELET
Absolute Monocytes: 531 {cells}/uL (ref 200–950)
Basophils Absolute: 30 {cells}/uL (ref 0–200)
Basophils Relative: 0.5 %
Eosinophils Absolute: 201 {cells}/uL (ref 15–500)
Eosinophils Relative: 3.4 %
HCT: 38.1 % — ABNORMAL LOW (ref 38.5–50.0)
Hemoglobin: 12.6 g/dL — ABNORMAL LOW (ref 13.2–17.1)
Lymphs Abs: 1451 {cells}/uL (ref 850–3900)
MCH: 29.6 pg (ref 27.0–33.0)
MCHC: 33.1 g/dL (ref 32.0–36.0)
MCV: 89.6 fL (ref 80.0–100.0)
MPV: 12.8 fL — ABNORMAL HIGH (ref 7.5–12.5)
Monocytes Relative: 9 %
Neutro Abs: 3688 {cells}/uL (ref 1500–7800)
Neutrophils Relative %: 62.5 %
Platelets: 238 Thousand/uL (ref 140–400)
RBC: 4.25 Million/uL (ref 4.20–5.80)
RDW: 13.3 % (ref 11.0–15.0)
Total Lymphocyte: 24.6 %
WBC: 5.9 Thousand/uL (ref 3.8–10.8)

## 2021-12-09 LAB — TEST AUTHORIZATION

## 2021-12-09 LAB — IRON, TOTAL/TOTAL IRON BINDING CAP

## 2021-12-09 NOTE — Telephone Encounter (Signed)
Opened in error

## 2021-12-09 NOTE — Telephone Encounter (Signed)
Spoke to pt, he wants to wait until July to have TCS. He is aware we will call back when July schedule is available. ?

## 2021-12-09 NOTE — Telephone Encounter (Signed)
Tried to call pt to schedule TCS ASA 3 w/Dr. Gala Romney, LMOVM for return call. ?

## 2021-12-12 ENCOUNTER — Other Ambulatory Visit: Payer: Self-pay | Admitting: *Deleted

## 2021-12-12 DIAGNOSIS — D649 Anemia, unspecified: Secondary | ICD-10-CM

## 2021-12-14 DIAGNOSIS — D649 Anemia, unspecified: Secondary | ICD-10-CM | POA: Diagnosis not present

## 2021-12-15 LAB — IRON,TIBC AND FERRITIN PANEL
%SAT: 25 % (calc) (ref 20–48)
Ferritin: 55 ng/mL (ref 24–380)
Iron: 64 ug/dL (ref 50–180)
TIBC: 261 mcg/dL (calc) (ref 250–425)

## 2021-12-21 ENCOUNTER — Encounter: Payer: Self-pay | Admitting: *Deleted

## 2021-12-21 MED ORDER — PEG 3350-KCL-NA BICARB-NACL 420 G PO SOLR: ORAL | 0 refills | Status: DC

## 2021-12-21 NOTE — Telephone Encounter (Signed)
Patient called back. He has been scheduled for 7/20 at [place] will mail prep instructions with pre-op appt. Will send prep rx to pharmacy.

## 2021-12-21 NOTE — Addendum Note (Signed)
Addended by: Cheron Every on: 12/21/2021 03:31 PM   Modules accepted: Orders

## 2021-12-21 NOTE — Telephone Encounter (Signed)
LMOVM to call back 

## 2022-02-13 ENCOUNTER — Telehealth: Payer: Self-pay | Admitting: Internal Medicine

## 2022-02-13 ENCOUNTER — Other Ambulatory Visit: Payer: Self-pay | Admitting: *Deleted

## 2022-02-13 MED ORDER — PEG 3350-KCL-NA BICARB-NACL 420 G PO SOLR: ORAL | 0 refills | Status: AC

## 2022-02-13 NOTE — Patient Instructions (Signed)
Jeff [name]  02/13/2022     '@PREFPERIOPPHARMACY'$ @   Your procedure is scheduled on 02/16/2022.   Report to Jeff [name] at  0800  A.M.   Call this number if you have problems the morning of surgery:  [phone]   Remember:  Follow the diet and prep instructions given to you by the office.     DO NOT take any medications for diabetes the morning of your procedure.    Take these medicines the morning of surgery with A SIP OF WATER                                   caduet, metoprolol.     Do not wear jewelry, make-up or nail polish.  Do not wear lotions, powders, or perfumes, or deodorant.  Do not shave 48 hours prior to surgery.  Men may shave face and neck.  Do not bring valuables to the hospital.  [facility] is not responsible for any belongings or valuables.  Contacts, dentures or bridgework may not be worn into surgery.  Leave your suitcase in the car.  After surgery it may be brought to your room.  For patients admitted to the hospital, discharge time will be determined by your treatment team.  Patients discharged the day of surgery will not be allowed to drive home and must have someone with them for 24 hours.    Special instructions:   DO NOT smoke tobacco or vape for 24 hours before your procedure.  Please read over the following fact sheets that you were given. Anesthesia Post-op Instructions and Care and Recovery After Surgery      Colonoscopy, Adult, Care After The following information offers guidance on how to care for yourself after your procedure. Your health care provider may also give you more specific instructions. If you have problems or questions, contact your health care provider. What can I expect after the procedure? After the procedure, it is common to have: A small amount of blood in your stool for 24 hours after the procedure. Some gas. Mild cramping or bloating of your abdomen. Follow these instructions at home: Eating and  drinking  Drink enough fluid to keep your urine pale yellow. Follow instructions from your health care provider about eating or drinking restrictions. Resume your normal diet as told by your health care provider. Avoid heavy or fried foods that are hard to digest. Activity Rest as told by your health care provider. Avoid sitting for a Jeff [name] time without moving. Get up to take short walks every 1-2 hours. This is important to improve blood flow and breathing. Ask for help if you feel weak or unsteady. Return to your normal activities as told by your health care provider. Ask your health care provider what activities are safe for you. Managing cramping and bloating  Try walking around when you have cramps or feel bloated. If directed, apply heat to your abdomen as told by your health care provider. Use the heat source that your health care provider recommends, such as a moist heat pack or a heating pad. Place a towel between your skin and the heat source. Leave the heat on for 20-30 minutes. Remove the heat if your skin turns bright red. This is especially important if you are unable to feel pain, heat, or cold. You have a greater risk of getting burned. General instructions If you were given  a sedative during the procedure, it can affect you for several hours. Do not drive or operate machinery until your health care provider says that it is safe. For the first 24 hours after the procedure: Do not sign important documents. Do not drink alcohol. Do your regular daily activities at a slower pace than normal. Eat soft foods that are easy to digest. Take over-the-counter and prescription medicines only as told by your health care provider. Keep all follow-up visits. This is important. Contact a health care provider if: You have blood in your stool 2-3 days after the procedure. Get help right away if: You have more than a small spotting of blood in your stool. You have large blood clots in your  stool. You have swelling of your abdomen. You have nausea or vomiting. You have a fever. You have increasing pain in your abdomen that is not relieved with medicine. These symptoms may be an emergency. Get help right away. Call 911. Do not wait to see if the symptoms will go away. Do not drive yourself to the hospital. Summary After the procedure, it is common to have a small amount of blood in your stool. You may also have mild cramping and bloating of your abdomen. If you were given a sedative during the procedure, it can affect you for several hours. Do not drive or operate machinery until your health care provider says that it is safe. Get help right away if you have a lot of blood in your stool, nausea or vomiting, a fever, or increased pain in your abdomen. This information is not intended to replace advice given to you by your health care provider. Make sure you discuss any questions you have with your health care provider. Document Revised: 03/09/2021 Document Reviewed: 03/09/2021 Elsevier Patient Education  [place] After This sheet gives you information about how to care for yourself after your procedure. Your health care provider may also give you more specific instructions. If you have problems or questions, contact your health care provider. What can I expect after the procedure? After the procedure, it is common to have: Tiredness. Forgetfulness about what happened after the procedure. Impaired judgment for important decisions. Nausea or vomiting. Some difficulty with balance. Follow these instructions at home: For the time period you were told by your health care provider:     Rest as needed. Do not participate in activities where you could fall or become injured. Do not drive or use machinery. Do not drink alcohol. Do not take sleeping pills or medicines that cause drowsiness. Do not make important decisions or sign legal  documents. Do not take care of children on your own. Eating and drinking Follow the diet that is recommended by your health care provider. Drink enough fluid to keep your urine pale yellow. If you vomit: Drink water, juice, or soup when you can drink without vomiting. Make sure you have little or no nausea before eating solid foods. General instructions Have a responsible adult stay with you for the time you are told. It is important to have someone help care for you until you are awake and alert. Take over-the-counter and prescription medicines only as told by your health care provider. If you have sleep apnea, surgery and certain medicines can increase your risk for breathing problems. Follow instructions from your health care provider about wearing your sleep device: Anytime you are sleeping, including during daytime naps. While taking prescription pain medicines, sleeping medicines, or  medicines that make you drowsy. Avoid smoking. Keep all follow-up visits as told by your health care provider. This is important. Contact a health care provider if: You keep feeling nauseous or you keep vomiting. You feel light-headed. You are still sleepy or having trouble with balance after 24 hours. You develop a rash. You have a fever. You have redness or swelling around the IV site. Get help right away if: You have trouble breathing. You have new-onset confusion at home. Summary For several hours after your procedure, you may feel tired. You may also be forgetful and have poor judgment. Have a responsible adult stay with you for the time you are told. It is important to have someone help care for you until you are awake and alert. Rest as told. Do not drive or operate machinery. Do not drink alcohol or take sleeping pills. Get help right away if you have trouble breathing, or if you suddenly become confused. This information is not intended to replace advice given to you by your health care  provider. Make sure you discuss any questions you have with your health care provider. Document Revised: 06/21/2021 Document Reviewed: 06/19/2019 Elsevier Patient Education  [place].

## 2022-02-13 NOTE — Telephone Encounter (Signed)
Called pharmacy. Was advised patient prep rx is ready for pick up.   Called pt, LMOVM

## 2022-02-13 NOTE — Telephone Encounter (Signed)
Patient went to pick up his prep and was told that is was "on hold"  please advise.

## 2022-02-14 ENCOUNTER — Encounter (HOSPITAL_COMMUNITY)
Admission: RE | Admit: 2022-02-14 | Discharge: 2022-02-14 | Disposition: A | Discharge status: Home / Self Care | Payer: Medicare HMO | Source: Ambulatory Visit | Attending: Internal Medicine | Admitting: Internal Medicine

## 2022-02-14 VITALS — BP 149/61 | HR 72 | Temp 97.8°F | Resp 18 | Ht 71.0 in | Wt 198.2 lb

## 2022-02-14 DIAGNOSIS — Z01818 Encounter for other preprocedural examination: Secondary | ICD-10-CM | POA: Insufficient documentation

## 2022-02-14 DIAGNOSIS — I10 Essential (primary) hypertension: Secondary | ICD-10-CM | POA: Diagnosis not present

## 2022-02-14 DIAGNOSIS — E119 Type 2 diabetes mellitus without complications: Secondary | ICD-10-CM | POA: Diagnosis not present

## 2022-02-14 LAB — BASIC METABOLIC PANEL
Anion gap: 5 (ref 5–15)
BUN: 10 mg/dL (ref 8–23)
CO2: 28 mmol/L (ref 22–32)
Calcium: 9.4 mg/dL (ref 8.9–10.3)
Chloride: 107 mmol/L (ref 98–111)
Creatinine, Ser: 0.86 mg/dL (ref 0.61–1.24)
GFR, Estimated: >60 mL/min (ref >60)
Glucose, Bld: 81 mg/dL (ref 70–99)
Potassium: 4.4 mmol/L (ref 3.5–5.1)
Sodium: 140 mmol/L (ref 135–145)

## 2022-02-16 ENCOUNTER — Ambulatory Visit (HOSPITAL_COMMUNITY): Payer: Medicare HMO | Admitting: Anesthesiology

## 2022-02-16 ENCOUNTER — Ambulatory Visit (HOSPITAL_BASED_OUTPATIENT_CLINIC_OR_DEPARTMENT_OTHER): Payer: Medicare HMO | Admitting: Anesthesiology

## 2022-02-16 ENCOUNTER — Encounter (HOSPITAL_COMMUNITY)
Admission: RE | Disposition: A | Discharge status: Home / Self Care | Payer: Self-pay | Source: Ambulatory Visit | Attending: Internal Medicine

## 2022-02-16 ENCOUNTER — Encounter (HOSPITAL_COMMUNITY): Payer: Self-pay | Admitting: Internal Medicine

## 2022-02-16 ENCOUNTER — Ambulatory Visit (HOSPITAL_COMMUNITY)
Admission: RE | Admit: 2022-02-16 | Discharge: 2022-02-16 | Disposition: A | Discharge status: Home / Self Care | Payer: Medicare HMO | Source: Ambulatory Visit | Attending: Internal Medicine | Admitting: Internal Medicine

## 2022-02-16 DIAGNOSIS — Z7984 Long term (current) use of oral hypoglycemic drugs: Secondary | ICD-10-CM | POA: Insufficient documentation

## 2022-02-16 DIAGNOSIS — Z79890 Hormone replacement therapy (postmenopausal): Secondary | ICD-10-CM | POA: Diagnosis not present

## 2022-02-16 DIAGNOSIS — D125 Benign neoplasm of sigmoid colon: Secondary | ICD-10-CM

## 2022-02-16 DIAGNOSIS — K635 Polyp of colon: Secondary | ICD-10-CM

## 2022-02-16 DIAGNOSIS — D649 Anemia, unspecified: Secondary | ICD-10-CM

## 2022-02-16 DIAGNOSIS — I2510 Atherosclerotic heart disease of native coronary artery without angina pectoris: Secondary | ICD-10-CM | POA: Insufficient documentation

## 2022-02-16 DIAGNOSIS — Z87891 Personal history of nicotine dependence: Secondary | ICD-10-CM | POA: Insufficient documentation

## 2022-02-16 DIAGNOSIS — E119 Type 2 diabetes mellitus without complications: Secondary | ICD-10-CM | POA: Diagnosis not present

## 2022-02-16 DIAGNOSIS — Z86010 Personal history of colonic polyps: Secondary | ICD-10-CM | POA: Diagnosis not present

## 2022-02-16 DIAGNOSIS — Z09 Encounter for follow-up examination after completed treatment for conditions other than malignant neoplasm: Secondary | ICD-10-CM

## 2022-02-16 DIAGNOSIS — K5730 Diverticulosis of large intestine without perforation or abscess without bleeding: Secondary | ICD-10-CM

## 2022-02-16 DIAGNOSIS — K219 Gastro-esophageal reflux disease without esophagitis: Secondary | ICD-10-CM | POA: Insufficient documentation

## 2022-02-16 DIAGNOSIS — Z79899 Other long term (current) drug therapy: Secondary | ICD-10-CM | POA: Insufficient documentation

## 2022-02-16 DIAGNOSIS — I10 Essential (primary) hypertension: Secondary | ICD-10-CM | POA: Insufficient documentation

## 2022-02-16 DIAGNOSIS — Z8546 Personal history of malignant neoplasm of prostate: Secondary | ICD-10-CM | POA: Insufficient documentation

## 2022-02-16 DIAGNOSIS — Z1211 Encounter for screening for malignant neoplasm of colon: Secondary | ICD-10-CM | POA: Insufficient documentation

## 2022-02-16 HISTORY — PX: COLONOSCOPY WITH PROPOFOL: SHX5780

## 2022-02-16 HISTORY — PX: POLYPECTOMY: SHX5525

## 2022-02-16 LAB — GLUCOSE, CAPILLARY: Glucose-Capillary: 85 mg/dL (ref 70–99)

## 2022-02-16 SURGERY — COLONOSCOPY WITH PROPOFOL
Anesthesia: General

## 2022-02-16 MED ORDER — PROPOFOL 10 MG/ML IV BOLUS
INTRAVENOUS | Status: DC | PRN
  Administered 2022-02-16: 100 mg via INTRAVENOUS

## 2022-02-16 MED ORDER — IPRATROPIUM-ALBUTEROL 0.5-2.5 (3) MG/3ML IN SOLN
3.0000 mL | Freq: Once | RESPIRATORY_TRACT | Status: AC
  Administered 2022-02-16: 3 mL via RESPIRATORY_TRACT

## 2022-02-16 MED ORDER — EPHEDRINE SULFATE-NACL 50-0.9 MG/10ML-% IV SOSY
PREFILLED_SYRINGE | INTRAVENOUS | Status: DC | PRN
  Administered 2022-02-16: 5 mg via INTRAVENOUS

## 2022-02-16 MED ORDER — IPRATROPIUM-ALBUTEROL 0.5-2.5 (3) MG/3ML IN SOLN
RESPIRATORY_TRACT | Status: AC
  Filled 2022-02-16: qty 3

## 2022-02-16 MED ORDER — PHENYLEPHRINE 80 MCG/ML (10ML) SYRINGE FOR IV PUSH (FOR BLOOD PRESSURE SUPPORT)
PREFILLED_SYRINGE | INTRAVENOUS | Status: AC
  Filled 2022-02-16: qty 10

## 2022-02-16 MED ORDER — LACTATED RINGERS IV SOLN: INTRAVENOUS | Status: DC

## 2022-02-16 MED ORDER — PHENYLEPHRINE 80 MCG/ML (10ML) SYRINGE FOR IV PUSH (FOR BLOOD PRESSURE SUPPORT)
PREFILLED_SYRINGE | INTRAVENOUS | Status: DC | PRN
  Administered 2022-02-16: 80 ug via INTRAVENOUS

## 2022-02-16 MED ORDER — PROPOFOL 500 MG/50ML IV EMUL
INTRAVENOUS | Status: DC | PRN
  Administered 2022-02-16: 150 ug/kg/min via INTRAVENOUS

## 2022-02-16 MED ORDER — LIDOCAINE HCL (CARDIAC) PF 100 MG/5ML IV SOSY
PREFILLED_SYRINGE | INTRAVENOUS | Status: DC | PRN
  Administered 2022-02-16: 50 mg via INTRAVENOUS

## 2022-02-16 NOTE — Anesthesia Postprocedure Evaluation (Signed)
Anesthesia Post Note  Patient: Jeff [name]  Procedure(s) Performed: COLONOSCOPY WITH PROPOFOL POLYPECTOMY  Patient location during evaluation: Phase II Anesthesia Type: General Level of consciousness: awake and alert and oriented Pain management: pain level controlled Vital Signs Assessment: post-procedure vital signs reviewed and stable Respiratory status: spontaneous breathing, nonlabored ventilation and respiratory function stable Cardiovascular status: blood pressure returned to baseline and stable Postop Assessment: no apparent nausea or vomiting Anesthetic complications: no   No notable events documented.   Last Vitals:  Vitals:   02/16/22 1022 02/16/22 1029  BP: 119/89   Pulse: 74   Resp: 20   Temp: 36.7 C   SpO2: 93% (!) 89%    Last Pain:  Vitals:   02/16/22 1022  TempSrc: Oral  PainSc: 0-No pain                 Chord Takahashi C Lacey Wallman

## 2022-02-16 NOTE — Op Note (Signed)
[facility] Patient Name: Jeff [name] Procedure Date: 02/16/2022 9:46 AM MRN: [id number] Date of Birth: [birth year] Attending MD: Norvel Richards , MD CSN: [id number] Age: 76 Admit Type: Outpatient Procedure:                Colonoscopy Indications:              High risk colon cancer surveillance: Personal                            history of colonic polyps Providers:                Norvel Richards, MD, Lambert Mody, Caprice Kluver, Everardo Pacific, [place] Risa Grill,                            Technician Referring MD:              Medicines:                Propofol per Anesthesia Complications:            No immediate complications. Estimated Blood Loss:     Estimated blood loss: none. Procedure:                Pre-Anesthesia Assessment:                           - Prior to the procedure, a History and Physical                            was performed, and patient medications and                            allergies were reviewed. The patient's tolerance of                            previous anesthesia was also reviewed. The risks                            and benefits of the procedure and the sedation                            options and risks were discussed with the patient.                            All questions were answered, and informed consent                            was obtained. Prior Anticoagulants: The patient has                            taken no previous anticoagulant or antiplatelet                            agents. ASA Grade  Assessment: III - A patient with                            severe systemic disease. After reviewing the risks                            and benefits, the patient was deemed in                            satisfactory condition to undergo the procedure.                           After obtaining informed consent, the colonoscope                            was passed under direct vision.  Throughout the                            procedure, the patient's blood pressure, pulse, and                            oxygen saturations were monitored continuously. The                            [id number]) scope was introduced through                            the anus and advanced to the the cecum, identified                            by appendiceal orifice and ileocecal valve. The                            colonoscopy was performed without difficulty. The                            patient tolerated the procedure well. The quality                            of the bowel preparation was adequate. Scope In: 10:00:20 AM Scope Out: 10:18:43 AM Scope Withdrawal Time: 0 hours 8 minutes 7 seconds  Total Procedure Duration: 0 hours 18 minutes 23 seconds  Findings:      The perianal and digital rectal examinations were normal.      Scattered small and large-mouthed diverticula were found in the sigmoid       colon, descending colon, transverse colon and ascending colon.      A 9 mm polyp was found in the splenic flexure. The polyp was       semi-pedunculated. The polyp was removed with a hot snare. Resection and       retrieval were complete. Estimated blood loss: none. Sigmoid segment       somewhat stiff with scattered. Diverticula petechiae.      The exam was otherwise without abnormality on direct and retroflexion       views. Impression:               -  Diverticulosis in the sigmoid colon, in the                            descending colon, in the transverse colon and in                            the ascending colon. Sigmoid segment somewhat                            noncompliant with submucosal petechiae. Findings                            suggestive of prior inflammation.                           - One 9 mm polyp at the splenic flexure, removed                            with a hot snare. Resected and retrieved.                           - The examination was  otherwise normal on direct                            and retroflexion views. Moderate Sedation:      Moderate (conscious) sedation was personally administered by an       anesthesia professional. The following parameters were monitored: oxygen       saturation, heart rate, blood pressure, respiratory rate, EKG, adequacy       of pulmonary ventilation, and response to care. Recommendation:           - Patient has a contact number available for                            emergencies. The signs and symptoms of potential                            delayed complications were discussed with the                            patient. Return to normal activities tomorrow.                            Written discharge instructions were provided to the                            patient.                           - Advance diet as tolerated.                           - Continue present medications.                           - Repeat  colonoscopy after studies are complete for                            surveillance based on pathology results.                           - Return to GI office (date not yet determined). Procedure Code(s):        --- Professional ---                           [phone], Colonoscopy, flexible; with removal of                            tumor(s), polyp(s), or other lesion(s) by snare                            technique Diagnosis Code(s):        --- Professional ---                           Z86.010, Personal history of colonic polyps                           K63.5, Polyp of colon                           K57.30, Diverticulosis of large intestine without                            perforation or abscess without bleeding CPT copyright 2019 [employer] Association. All rights reserved. The codes documented in this report are preliminary and upon coder review may  be revised to meet current compliance requirements. Cristopher Estimable. Tenelle Andreason, MD Norvel Richards, MD 02/16/2022  10:26:34 AM This report has been signed electronically. Number of Addenda: 0

## 2022-02-16 NOTE — Transfer of Care (Signed)
Immediate Anesthesia Transfer of Care Note  Patient: Jeff [name]  Procedure(s) Performed: COLONOSCOPY WITH PROPOFOL POLYPECTOMY  Patient Location: Short Stay  Anesthesia Type:General  Level of Consciousness: awake  Airway & Oxygen Therapy: Patient Spontanous Breathing and Patient connected to nasal cannula oxygen  Post-op Assessment: Report given to RN and Post -op Vital signs reviewed and stable  Post vital signs: Reviewed and stable  Last Vitals:  Vitals Value Taken Time  BP    Temp    Pulse    Resp    SpO2      Last Pain:  Vitals:   02/16/22 0957  TempSrc:   PainSc: 0-No pain      Patients Stated Pain Goal: 8 (96/72/89 7915)  Complications: No notable events documented.

## 2022-02-16 NOTE — Discharge Instructions (Signed)
  Colonoscopy Discharge Instructions  Read the instructions outlined below and refer to this sheet in the next few weeks. These discharge instructions provide you with general information on caring for yourself after you leave the hospital. Your doctor may also give you specific instructions. While your treatment has been planned according to the most current medical practices available, unavoidable complications occasionally occur. If you have any problems or questions after discharge, call Dr. Gala Romney at [phone]. ACTIVITY You may resume your regular activity, but move at a slower pace for the next 24 hours.  Take frequent rest periods for the next 24 hours.  Walking will help get rid of the air and reduce the bloated feeling in your belly (abdomen).  No driving for 24 hours (because of the medicine (anesthesia) used during the test).   Do not sign any important legal documents or operate any machinery for 24 hours (because of the anesthesia used during the test).  NUTRITION Drink plenty of fluids.  You may resume your normal diet as instructed by your doctor.  Begin with a light meal and progress to your normal diet. Heavy or fried foods are harder to digest and may make you feel sick to your stomach (nauseated).  Avoid alcoholic beverages for 24 hours or as instructed.  MEDICATIONS You may resume your normal medications unless your doctor tells you otherwise.  WHAT YOU CAN EXPECT TODAY Some feelings of bloating in the abdomen.  Passage of more gas than usual.  Spotting of blood in your stool or on the toilet paper.  IF YOU HAD POLYPS REMOVED DURING THE COLONOSCOPY: No aspirin products for 7 days or as instructed.  No alcohol for 7 days or as instructed.  Eat a soft diet for the next 24 hours.  FINDING OUT THE RESULTS OF YOUR TEST Not all test results are available during your visit. If your test results are not back during the visit, make an appointment with your caregiver to find out the  results. Do not assume everything is normal if you have not heard from your caregiver or the medical facility. It is important for you to follow up on all of your test results.  SEEK IMMEDIATE MEDICAL ATTENTION IF: You have more than a spotting of blood in your stool.  Your belly is swollen (abdominal distention).  You are nauseated or vomiting.  You have a temperature over 101.  You have abdominal pain or discomfort that is severe or gets worse throughout the day.     1 polyp removed from your colon today.  Colon polyp and diverticulosis information provided  Further recommendations to follow pending review of pathology report  At patient request, I called Jesse at [phone] rolled to voicemail.  Left a message.

## 2022-02-16 NOTE — Progress Notes (Signed)
Pt sister is willing to stay with pt today. States she will be at his house in an hour. Pt and ride is aware.  PT informed to come back to hospital if he has any SOB or fevers, chills. Pt verbalizes understanding of possible pneumonia symptoms. Pt denies any symptoms at this time, VSS, ambulatory.

## 2022-02-16 NOTE — Anesthesia Procedure Notes (Addendum)
Date/Time: 02/16/2022 9:58 AM  Performed by: Orlie Dakin, CRNAPre-anesthesia Checklist: Patient identified, Emergency Drugs available, Suction available and Patient being monitored Patient Re-evaluated:Patient Re-evaluated prior to induction Oxygen Delivery Method: Nasal cannula Induction Type: IV induction Placement Confirmation: positive ETCO2

## 2022-02-16 NOTE — H&P (Signed)
$'@LOGO'P$ @   Primary Care Physician:  Sharilyn Sites, MD Primary Gastroenterologist:  Dr. Gala Romney  Pre-Procedure History & Physical: HPI:  Jeff [name] is a 76 y.o. male here for   Past Medical History:  Diagnosis Date   Cancer [facility])    Carotid artery disease ([place])    Coronary artery disease    Diabetes ([place])    diet controlled   Essential hypertension    GERD (gastroesophageal reflux disease)    History of stroke 2011   Mixed hyperlipidemia    Prostate cancer ([place]) 2004   Stroke [facility])    no deficits    Past Surgical History:  Procedure Laterality Date   carotid duplex bilateral  01/02/12   CAROTID ENDARTERECTOMY Right 2011   CATARACT EXTRACTION W/PHACO Left 01/20/2019   Procedure: CATARACT EXTRACTION PHACO AND INTRAOCULAR LENS PLACEMENT LEFT EYE;  Surgeon: Baruch Goldmann, MD;  Location: [facility];  Service: Ophthalmology;  Laterality: Left;  CDE: 10.2   CATARACT EXTRACTION W/PHACO Right 04/18/2019   Procedure: CATARACT EXTRACTION PHACO AND INTRAOCULAR LENS PLACEMENT RIGHT EYE;  Surgeon: Baruch Goldmann, MD;  Location: [facility];  Service: Ophthalmology;  Laterality: Right;  right   COLONOSCOPY  03/12/2006   RMR: Normal rectum/ Markedly inflamed sigmoid colon with rectal sparing more proximal  colon.  The level of the cecum appeared normal, there were some small   cecal ulcers, markedly abnormal terminal ileum, status post biopsy of sigmoid colon and terminal ileum mucosa. Biopsy showed active colitis, nonspecific there   COLONOSCOPY   02/25/2003   WUJ:[id number] polyps in the rectum (destroyed with the tip of the snare unit) not described above.  Otherwise normal rectum/. Left-sided diverticula/Sessile polyps as described above in the sigmoid colon at 25 and 35 sm.. polyps benign/inflammatory   myocardial perfusion  02/14/10   PROSTATE SURGERY  2004   TRANSTHORACIC ECHOCARDIOGRAM      Prior to Admission medications   Medication Sig Start Date End Date Taking? Authorizing Provider   Acetaminophen 500 MG capsule Take 500 mg by mouth at bedtime as needed (sleep).   Yes [provider]  amLODipine-atorvastatin (CADUET) 5-10 MG tablet Take 1 tablet by mouth daily.   Yes [provider]  aspirin EC 325 MG tablet Take 325 mg by mouth every morning.    Yes [provider]  b complex-C-folic acid 1 MG capsule Take 1 capsule by mouth daily.   Yes [provider]  cholecalciferol (VITAMIN D3) 25 MCG (1000 UT) tablet Take 1,000 Units by mouth daily.   Yes [provider]  losartan (COZAAR) 100 MG tablet Take 100 mg by mouth daily.  04/23/13  Yes [provider]  metoprolol succinate (TOPROL-XL) 25 MG 24 hr tablet Take 25 mg by mouth daily.   Yes [provider]  montelukast (SINGULAIR) 10 MG tablet Take 10 mg by mouth at bedtime.   Yes [provider]  polyethylene glycol-electrolytes (NULYTELY) 420 g solution As directed 02/13/22  Yes [place] Kielbasa, Cristopher Estimable, MD  simvastatin (ZOCOR) 40 MG tablet Take 40 mg by mouth every evening.   Yes [provider]    Allergies as of 12/21/2021   (No Known Allergies)    Family History  Problem Relation Age of Onset   CAD Mother    Stroke Mother    CAD Brother        Age 47s   Heart disease Brother    CAD Sister        Age 78s  Stroke Sister    Cancer Father    Stroke Maternal Grandmother    Cancer Paternal Grandfather    Colon cancer Neg Hx     Social History   Socioeconomic History   Marital status: Single    Spouse name: Not on file   Number of children: Not on file   Years of education: Not on file   Highest education level: Not on file  Occupational History   Not on file  Tobacco Use   Smoking status: Former    Packs/day: 0.25    Years: 15.00    Total pack years: 3.75    Types: Cigarettes    Quit date: 01/15/2010    Years since quitting: 12.0   Smokeless tobacco: Former  Substance and Sexual Activity   Alcohol use: Not Currently   Drug use:  No   Sexual activity: Not on file  Other Topics Concern   Not on file  Social History Narrative   Not on file   Social Determinants of Health   Financial Resource Strain: Not on file  Food Insecurity: Not on file  Transportation Needs: Not on file  Physical Activity: Not on file  Stress: Not on file  Social Connections: Not on file  Intimate Partner Violence: Not on file    Review of Systems: See HPI, otherwise negative ROS  Physical Exam: BP (!) 141/67   Pulse 73   Temp 97.9 F (36.6 C) (Oral)   Resp (!) 21   SpO2 98%  General:   Alert,  Well-developed, well-nourished, pleasant and cooperative in NAD pathy. Lungs:  Clear throughout to auscultation.   No wheezes, crackles, or rhonchi. No acute distress. Heart:  Regular rate and rhythm; no murmurs, clicks, rubs,  or gallops. Abdomen: Non-distended, normal bowel sounds.  Soft and nontender without appreciable mass or hepatosplenomegaly.  Pulses:  Normal pulses noted. Extremities:  Without clubbing or edema.  Impression/Plan: 76 year old gentleman here for surveillance colonoscopy.  History of polyps removed in the distant past.  History of mild anemia but he is not iron deficient.  He has no upper GI tract symptoms.  Recommendations: I have offered him a surveillance colonoscopy today.  The risks, benefits, limitations, alternatives and imponderables have been reviewed with the patient. Questions have been answered. All parties are agreeable.       Notice: This dictation was prepared with Dragon dictation along with smaller phrase technology. Any transcriptional errors that result from this process are unintentional and may not be corrected upon review.

## 2022-02-16 NOTE — Progress Notes (Signed)
Dr. Charna Elizabeth at bedside in post op to access pt. PT denies any SOB, + cough, 91-92 % on RA.

## 2022-02-16 NOTE — Anesthesia Preprocedure Evaluation (Addendum)
Anesthesia Evaluation  Patient identified by MRN, date of birth, ID band Patient awake    Reviewed: Allergy & Precautions, NPO status , Patient's Chart, lab work & pertinent test results, reviewed documented beta blocker date and time   Airway Mallampati: II  TM Distance: >3 FB Neck ROM: Full    Dental  (+) Dental Advisory Given, Edentulous Upper, Missing, Chipped   Pulmonary former smoker,    Pulmonary exam normal breath sounds clear to auscultation       Cardiovascular Exercise Tolerance: Good hypertension, Pt. on medications and Pt. on home beta blockers + CAD and + Peripheral Vascular Disease (carotid sx)   Rhythm:Regular Rate:Normal + Systolic murmurs    Neuro/Psych CVA, No Residual Symptoms negative psych ROS   GI/Hepatic Neg liver ROS, GERD  Medicated and Controlled,  Endo/Other  diabetes, Well Controlled, Type 2, Oral Hypoglycemic Agents  Renal/GU negative Renal ROS Bladder dysfunction (prostate cancer)      Musculoskeletal negative musculoskeletal ROS (+)   Abdominal   Peds negative pediatric ROS (+)  Hematology  (+) Blood dyscrasia, anemia ,   Anesthesia Other Findings   Reproductive/Obstetrics negative OB ROS                           Anesthesia Physical Anesthesia Plan  ASA: 3  Anesthesia Plan: General   Post-op Pain Management: Minimal or no pain anticipated   Induction: Intravenous  PONV Risk Score and Plan: Propofol infusion  Airway Management Planned: Nasal Cannula and Natural Airway  Additional Equipment:   Intra-op Plan:   Post-operative Plan:   Informed Consent: I have reviewed the patients History and Physical, chart, labs and discussed the procedure including the risks, benefits and alternatives for the proposed anesthesia with the patient or authorized representative who has indicated his/her understanding and acceptance.     Dental advisory  given  Plan Discussed with: CRNA and Surgeon  Anesthesia Plan Comments:         Anesthesia Quick Evaluation

## 2022-02-17 ENCOUNTER — Encounter: Payer: Self-pay | Admitting: Internal Medicine

## 2022-02-17 LAB — SURGICAL PATHOLOGY

## 2022-02-22 ENCOUNTER — Encounter (HOSPITAL_COMMUNITY): Payer: Self-pay | Admitting: Internal Medicine

## 2022-04-13 DIAGNOSIS — C61 Malignant neoplasm of prostate: Secondary | ICD-10-CM | POA: Diagnosis not present

## 2022-04-13 DIAGNOSIS — E119 Type 2 diabetes mellitus without complications: Secondary | ICD-10-CM | POA: Diagnosis not present

## 2022-04-13 DIAGNOSIS — E782 Mixed hyperlipidemia: Secondary | ICD-10-CM | POA: Diagnosis not present

## 2022-04-13 DIAGNOSIS — I10 Essential (primary) hypertension: Secondary | ICD-10-CM | POA: Diagnosis not present

## 2022-04-13 DIAGNOSIS — I6521 Occlusion and stenosis of right carotid artery: Secondary | ICD-10-CM | POA: Diagnosis not present

## 2022-04-13 DIAGNOSIS — I2510 Atherosclerotic heart disease of native coronary artery without angina pectoris: Secondary | ICD-10-CM | POA: Diagnosis not present

## 2022-04-13 DIAGNOSIS — E7849 Other hyperlipidemia: Secondary | ICD-10-CM | POA: Diagnosis not present

## 2022-04-13 DIAGNOSIS — Z0001 Encounter for general adult medical examination with abnormal findings: Secondary | ICD-10-CM | POA: Diagnosis not present

## 2022-04-13 DIAGNOSIS — Z6827 Body mass index (BMI) 27.0-27.9, adult: Secondary | ICD-10-CM | POA: Diagnosis not present

## 2022-04-13 DIAGNOSIS — I708 Atherosclerosis of other arteries: Secondary | ICD-10-CM | POA: Diagnosis not present

## 2022-04-13 DIAGNOSIS — Z1331 Encounter for screening for depression: Secondary | ICD-10-CM | POA: Diagnosis not present

## 2022-11-30 DIAGNOSIS — H5203 Hypermetropia, bilateral: Secondary | ICD-10-CM | POA: Diagnosis not present

## 2023-04-18 DIAGNOSIS — Z1331 Encounter for screening for depression: Secondary | ICD-10-CM | POA: Diagnosis not present

## 2023-04-18 DIAGNOSIS — I10 Essential (primary) hypertension: Secondary | ICD-10-CM | POA: Diagnosis not present

## 2023-04-18 DIAGNOSIS — Z6829 Body mass index (BMI) 29.0-29.9, adult: Secondary | ICD-10-CM | POA: Diagnosis not present

## 2023-04-18 DIAGNOSIS — E663 Overweight: Secondary | ICD-10-CM | POA: Diagnosis not present

## 2023-04-18 DIAGNOSIS — Z0001 Encounter for general adult medical examination with abnormal findings: Secondary | ICD-10-CM | POA: Diagnosis not present

## 2023-04-18 DIAGNOSIS — I708 Atherosclerosis of other arteries: Secondary | ICD-10-CM | POA: Diagnosis not present

## 2023-04-18 DIAGNOSIS — C61 Malignant neoplasm of prostate: Secondary | ICD-10-CM | POA: Diagnosis not present

## 2023-04-18 DIAGNOSIS — I2510 Atherosclerotic heart disease of native coronary artery without angina pectoris: Secondary | ICD-10-CM | POA: Diagnosis not present

## 2023-04-18 DIAGNOSIS — I6521 Occlusion and stenosis of right carotid artery: Secondary | ICD-10-CM | POA: Diagnosis not present

## 2023-04-18 DIAGNOSIS — E119 Type 2 diabetes mellitus without complications: Secondary | ICD-10-CM | POA: Diagnosis not present

## 2023-04-18 DIAGNOSIS — E782 Mixed hyperlipidemia: Secondary | ICD-10-CM | POA: Diagnosis not present

## 2023-04-18 DIAGNOSIS — E7849 Other hyperlipidemia: Secondary | ICD-10-CM | POA: Diagnosis not present

## 2024-03-19 ENCOUNTER — Emergency Department (HOSPITAL_COMMUNITY)
Admission: EM | Admit: 2024-03-19 | Discharge: 2024-03-19 | Disposition: A | Discharge status: Home / Self Care | Attending: Emergency Medicine | Admitting: Emergency Medicine

## 2024-03-19 ENCOUNTER — Encounter (HOSPITAL_COMMUNITY): Payer: Self-pay | Admitting: Emergency Medicine

## 2024-03-19 ENCOUNTER — Other Ambulatory Visit: Payer: Self-pay

## 2024-03-19 ENCOUNTER — Emergency Department (HOSPITAL_COMMUNITY)

## 2024-03-19 DIAGNOSIS — R0602 Shortness of breath: Secondary | ICD-10-CM | POA: Diagnosis not present

## 2024-03-19 DIAGNOSIS — R110 Nausea: Secondary | ICD-10-CM | POA: Diagnosis not present

## 2024-03-19 DIAGNOSIS — I10 Essential (primary) hypertension: Secondary | ICD-10-CM | POA: Diagnosis not present

## 2024-03-19 DIAGNOSIS — R011 Cardiac murmur, unspecified: Secondary | ICD-10-CM | POA: Diagnosis not present

## 2024-03-19 DIAGNOSIS — I2510 Atherosclerotic heart disease of native coronary artery without angina pectoris: Secondary | ICD-10-CM | POA: Insufficient documentation

## 2024-03-19 DIAGNOSIS — E119 Type 2 diabetes mellitus without complications: Secondary | ICD-10-CM | POA: Diagnosis not present

## 2024-03-19 DIAGNOSIS — R079 Chest pain, unspecified: Secondary | ICD-10-CM | POA: Diagnosis not present

## 2024-03-19 DIAGNOSIS — Z79899 Other long term (current) drug therapy: Secondary | ICD-10-CM | POA: Diagnosis not present

## 2024-03-19 DIAGNOSIS — R0789 Other chest pain: Secondary | ICD-10-CM | POA: Insufficient documentation

## 2024-03-19 LAB — BASIC METABOLIC PANEL WITH GFR
Anion gap: 11 (ref 5–15)
BUN: 13 mg/dL (ref 8–23)
CO2: 24 mmol/L (ref 22–32)
Calcium: 8.9 mg/dL (ref 8.9–10.3)
Chloride: 102 mmol/L (ref 98–111)
Creatinine, Ser: 0.91 mg/dL (ref 0.61–1.24)
GFR, Estimated: >60 mL/min (ref >60)
Glucose, Bld: 123 mg/dL — ABNORMAL HIGH (ref 70–99)
Potassium: 3.7 mmol/L (ref 3.5–5.1)
Sodium: 137 mmol/L (ref 135–145)

## 2024-03-19 LAB — CBC
HCT: 37.9 % — ABNORMAL LOW (ref 39.0–52.0)
Hemoglobin: 12.4 g/dL — ABNORMAL LOW (ref 13.0–17.0)
MCH: 30.2 pg (ref 26.0–34.0)
MCHC: 32.7 g/dL (ref 30.0–36.0)
MCV: 92.2 fL (ref 80.0–100.0)
Platelets: 217 K/uL (ref 150–400)
RBC: 4.11 MIL/uL — ABNORMAL LOW (ref 4.22–5.81)
RDW: 13.2 % (ref 11.5–15.5)
WBC: 4.6 K/uL (ref 4.0–10.5)
nRBC: 0 % (ref 0.0–0.2)

## 2024-03-19 LAB — TROPONIN I (HIGH SENSITIVITY)
Troponin I (High Sensitivity): 16 ng/L (ref <18)
Troponin I (High Sensitivity): 18 ng/L — ABNORMAL HIGH (ref <18)
Troponin I (High Sensitivity): 16 ng/L (ref <18)

## 2024-03-19 NOTE — ED Provider Notes (Signed)
 [place] EMERGENCY DEPARTMENT AT [facility] Provider Note   CSN: [id number] Arrival date & time: 03/19/24  9051     Patient presents with: Chest Pain   Jeff [name] is a 78 y.o. male with a including hypertension, hyperlipidemia, CAD, does not routinely see cardiology, type 2 diabetes, history of CVA and history of carotid artery disease status post carotid endarterectomy in 2011, also history of GERD presenting with a several week history of intermittent lower sternal chest pressure along with shortness of breath and nausea.  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  He states when he drinks water the symptom generally resolves.  This morning he had an episode lasting 15 minutes which was longer than previous episodes, today's episode occurred while he was at rest and he has been symptom-free for approximately 1 hour prior to arrival.  He has taken his normal morning medicines including aspirin 325 mg prior to arrival.    The history is provided by the patient.       Prior to Admission medications   Medication Sig Start Date End Date Taking? Authorizing Provider  Acetaminophen 500 MG capsule Take 500 mg by mouth at bedtime as needed (sleep).    [provider]  amLODipine-atorvastatin (CADUET) 5-10 MG tablet Take 1 tablet by mouth daily.    [provider]  aspirin EC 325 MG tablet Take 325 mg by mouth every morning.     [provider]  b complex-C-folic acid  1 MG capsule Take 1 capsule by mouth daily.    [provider]  cholecalciferol (VITAMIN D3) 25 MCG (1000 UT) tablet Take 1,000 Units by mouth daily.    [provider]  losartan (COZAAR) 100 MG tablet Take 100 mg by mouth daily.  04/23/13   [provider]  metoprolol succinate (TOPROL-XL) 25 MG 24 hr tablet Take 25 mg by mouth daily.    [provider]   montelukast (SINGULAIR) 10 MG tablet Take 10 mg by mouth at bedtime.    [provider]  polyethylene glycol-electrolytes (NULYTELY) 420 g solution As directed 02/13/22   Rourk, Lamar HERO, MD  simvastatin (ZOCOR) 40 MG tablet Take 40 mg by mouth every evening.    [provider]    Allergies: Patient has no known allergies.    Review of Systems  Constitutional:  Negative for fever.  HENT:  Negative for congestion and sore throat.   Eyes: Negative.   Respiratory:  Positive for shortness of breath. Negative for chest tightness.   Cardiovascular:  Positive for chest pain. Negative for palpitations and leg swelling.  Gastrointestinal:  Positive for nausea. Negative for abdominal pain.  Genitourinary: Negative.   Musculoskeletal:  Negative for arthralgias, joint swelling and neck pain.  Skin: Negative.  Negative for rash and wound.  Neurological:  Negative for dizziness, weakness, light-headedness, numbness and headaches.  Psychiatric/Behavioral: Negative.      Updated Vital Signs BP (!) 140/77   Pulse 71   Temp 97.8 F (36.6 C) (Oral)   Resp 15   Ht 5' 11 (1.803 m)   Wt 90.7 kg   SpO2 97%   BMI 27.89 kg/m   Physical Exam Vitals and nursing note reviewed.  Constitutional:      Appearance: He is well-developed.  HENT:     Head: Normocephalic and atraumatic.  Eyes:     Conjunctiva/sclera: Conjunctivae normal.  Cardiovascular:     Rate and Rhythm: Normal rate and regular rhythm.     Heart sounds: Murmur heard.     Systolic murmur is present with a grade of 3/6.  Pulmonary:     Effort: Pulmonary effort is normal.     Breath sounds: Normal breath sounds. No wheezing, rhonchi or rales.  Abdominal:     General: Bowel sounds are normal.     Palpations: Abdomen is soft.     Tenderness: There is no abdominal tenderness.  Musculoskeletal:        General: Normal range of motion.     Cervical back: Normal range of motion.     Right lower leg: No tenderness. No  edema.     Left lower leg: No tenderness. No edema.  Skin:    General: Skin is warm and dry.     Comments: Keloid chest   Neurological:     Mental Status: He is alert.     (all labs ordered are listed, but only abnormal results are displayed) Labs Reviewed  BASIC METABOLIC PANEL WITH GFR - Abnormal; Notable for the following components:      Result Value   Glucose, Bld 123 (*)    All other components within normal limits  CBC - Abnormal; Notable for the following components:   RBC 4.11 (*)    Hemoglobin 12.4 (*)    HCT 37.9 (*)    All other components within normal limits  TROPONIN I (HIGH SENSITIVITY) - Abnormal; Notable for the following components:   Troponin I (High Sensitivity) 18 (*)    All other components within normal limits  TROPONIN I (HIGH SENSITIVITY)  TROPONIN I (HIGH SENSITIVITY)    EKG: EKG Interpretation Date/Time:  Wednesday March 19 2024 13:43:12 EDT Ventricular Rate:  80 PR Interval:  218 QRS Duration:  87 QT Interval:  358 QTC Calculation: 413 R Axis:   49  Text Interpretation: Sinus rhythm Borderline prolonged PR interval HR slower otherwise ECG similar to earlier in the day Confirmed by Freddi Hamilton [phone]) on 03/19/2024 1:54:36 PM ED ECG REPORT   Date: 03/19/2024  Rate: 80  Rhythm: normal sinus rhythm  QRS Axis: normal  Intervals: PR prolonged  ST/T Wave abnormalities: normal  Conduction Disutrbances:none  Narrative Interpretation: borderline prolonged PR 218  Old EKG Reviewed: changes noted  I have personally reviewed the EKG tracing and agree with the computerized printout as noted.  Radiology: [facility] Chest Port 1 View Result Date: 03/19/2024 CLINICAL DATA:  Chest pain. EXAM: PORTABLE CHEST 1 VIEW COMPARISON:  03/08/2012. FINDINGS: The heart size and mediastinal contours are within normal limits. No focal consolidation, pleural effusion, or pneumothorax. No acute osseous abnormality. IMPRESSION: No acute cardiopulmonary findings.  Electronically Signed   By: Harrietta Sherry M.D.   On: 03/19/2024 11:00     Procedures   Medications Ordered in the ED - No data to display                                  Medical Decision Making Patient presenting with a 2-week history of intermittent chest pressure associated with shortness of breath and mild nausea, he is currently symptom-free but had approximately 15-minute episode prior to arrival today.  His prior episodes were usually with ambulation at work, he works at [employer], today's event occurred while sitting at home.  Symptoms resolved spontaneously today, with prior episodes he states  they generally resolve when he drinks a glass of water.  He denies any classic GERD like symptoms.  Given risk factors concern for new onset angina versus unstable angina versus acute MI.  Less likely including pneumonia, PE highly doubted given infrequent episodic episodes.  Amount and/or Complexity of Data Reviewed Labs: ordered.    Details: Labs reviewed including CBC, c-Met and multiple troponins, no significant findings on lab testing other than a borderline low hemoglobin at 12.4.  He had a total of 3 troponins since he was unwilling to be admitted per cardiology recommendation.  Initial troponin was 16, second troponin was 18, his third troponin was 16. Radiology: ordered.    Details: Chest x-ray reviewed, no acute cardiopulmonary findings. ECG/medicine tests: ordered and independent interpretation performed.    Details: Sinus rhythm, borderline prolonged PR, rate 80. Discussion of management or test interpretation with external provider(s): Pt discussed with Dr. Mallipeddi who recommends admission for echo,  planned cath tomorrow. This was discussed with pt who is not interested in being admitted.  This was also relayed to Dr. Mallipeddi who suggested 3rd troponin and if remains flat,  close outpatient cardiology f/u.  Pt is agreeable with this plan.    Risk Decision  regarding hospitalization. Diagnosis or treatment significantly limited by social determinants of health. Risk Details: Patient was strongly encouraged to be admitted to the hospital given his symptoms, concerning for  new angina, cannot rule out symptoms being unstable although EKG and 3 troponins are reassuring today.  He was given strict return precautions regarding any return or worsening symptoms.  He was offered admission several times during his stay, he is not motivated to take this step but will agree with close follow-up with cardiology.  Outpatient cardiology follow-up has been requested.        Final diagnoses:  Chest pain, unspecified type    ED Discharge Orders          Ordered    Ambulatory referral to Cardiology       Comments: If you have not heard from the Cardiology office within the next 72 hours please call [phone].   03/19/24 1535               Birdena Mliss RIGGERS 03/19/24 2009    Freddi Hamilton, MD 03/20/24 [phone]

## 2024-03-19 NOTE — ED Triage Notes (Signed)
 Pt states he has been having chest pain on and off x 2 weeks w/ SOB. Pt denies pain at this time.

## 2024-03-19 NOTE — Discharge Instructions (Addendum)
 Your lab tests today are stable but as discussed your symptoms are concerning for possibly being due to a cardiac problem.  You will need close outpatient follow-up with cardiology since you do not want to stay today.  I am sending a referral to our cardiologist, their office should reach out to you within the next 1 to 2 days to schedule an office visit with them.  In the interim return here if you develop any return, especially worsening symptoms.  Continue taking your aspirin daily.

## 2024-03-19 NOTE — ED Notes (Signed)
 Reviewed D/C information with the patient, pt verbalized understanding. No additional concerns at this time.

## 2024-03-26 ENCOUNTER — Encounter: Payer: Self-pay | Admitting: Internal Medicine

## 2024-03-26 ENCOUNTER — Ambulatory Visit: Attending: Internal Medicine | Admitting: Internal Medicine

## 2024-03-26 VITALS — BP 170/80 | HR 87 | Wt 196.6 lb

## 2024-03-26 DIAGNOSIS — R0789 Other chest pain: Secondary | ICD-10-CM | POA: Diagnosis not present

## 2024-03-26 DIAGNOSIS — Z9889 Other specified postprocedural states: Secondary | ICD-10-CM | POA: Insufficient documentation

## 2024-03-26 DIAGNOSIS — Z8673 Personal history of transient ischemic attack (TIA), and cerebral infarction without residual deficits: Secondary | ICD-10-CM | POA: Diagnosis not present

## 2024-03-26 DIAGNOSIS — I340 Nonrheumatic mitral (valve) insufficiency: Secondary | ICD-10-CM | POA: Insufficient documentation

## 2024-03-26 MED ORDER — OMEPRAZOLE 20 MG PO CPDR
20.0000 mg | DELAYED_RELEASE_CAPSULE | Freq: Every day | ORAL | 3 refills | Status: AC
Start: 2024-03-26 — End: ?

## 2024-03-26 MED ORDER — ASPIRIN 81 MG PO TBEC: 81.0000 mg | DELAYED_RELEASE_TABLET | Freq: Every day | ORAL | Status: AC

## 2024-03-26 NOTE — Patient Instructions (Addendum)
 Medication Instructions:   STOP Aspirin  325 mg  START Aspirin  81 mg daily  START Omeprazole  20 mg daily  Labwork: None today  Testing/Procedures: Your physician has requested that you have an echocardiogram. Echocardiography is a painless test that uses sound waves to create images of your heart. It provides your doctor with information about the size and shape of your heart and how well your heart's chambers and valves are working. This procedure takes approximately one hour. There are no restrictions for this procedure. Please do NOT wear cologne, perfume, aftershave, or lotions (deodorant is allowed). Please arrive 15 minutes prior to your appointment time.  Please note: We ask at that you not bring children with you during ultrasound (echo/ vascular) testing. Due to room size and safety concerns, children are not allowed in the ultrasound rooms during exams. Our front office staff cannot provide observation of children in our lobby area while testing is being conducted. An adult accompanying a patient to their appointment will only be allowed in the ultrasound room at the discretion of the ultrasound technician under special circumstances. We apologize for any inconvenience.  Follow-Up: 6 months  Any Other Special Instructions Will Be Listed Below (If Applicable).  If you need a refill on your cardiac medications before your next appointment, please call your pharmacy.

## 2024-03-26 NOTE — Progress Notes (Signed)
 Cardiology Office Note  Date: 03/26/2024   ID: Jeff [name] Jeff [name], MRN [id number]  PCP:  Jeff Rush, MD  Cardiologist:  Jeff SHAUNNA Maywood, MD Electrophysiologist:  None   History of Present Illness: Jeff [name] is a 78 y.o. male  Referred to cardiology clinic for chest pain, post ER visit.  Patient has a history of carotid artery disease with previous stroke back in 2011. S/p R CEA.  Less than 50% carotid artery stenosis bilaterally by imaging in 2017.  Patient had substernal chest tightness almost daily and most commonly in the morning.  When he drinks a glass of water, chest tightness completely resolves.  Denies having any chest pain/tightness with exertion at work (he works at [employer]).  He also gets mild SOB in the morning when he gets chest tightness.  Otherwise denies any DOE at work.  No dizziness, lightheadedness, syncope, palpitations or leg swelling.  Past Medical History:  Diagnosis Date   Cancer [facility])    Carotid artery disease (HCC)    Coronary artery disease    Diabetes (HCC)    diet controlled   Essential hypertension    GERD (gastroesophageal reflux disease)    History of stroke 2011   Mixed hyperlipidemia    Prostate cancer (HCC) 2004   Stroke [facility])    no deficits    Past Surgical History:  Procedure Laterality Date   carotid duplex bilateral  01/02/12   CAROTID ENDARTERECTOMY Right 2011   CATARACT EXTRACTION W/PHACO Left 01/20/2019   Procedure: CATARACT EXTRACTION PHACO AND INTRAOCULAR LENS PLACEMENT LEFT EYE;  Surgeon: Jeff Agent, MD;  Location: [facility] ORS;  Service: Ophthalmology;  Laterality: Left;  CDE: 10.2   CATARACT EXTRACTION W/PHACO Right 04/18/2019   Procedure: CATARACT EXTRACTION PHACO AND INTRAOCULAR LENS PLACEMENT RIGHT EYE;  Surgeon: Jeff Agent, MD;  Location: [facility] ORS;  Service: Ophthalmology;  Laterality: Right;  right   COLONOSCOPY  03/12/2006   RMR: Normal rectum/ Markedly inflamed sigmoid colon with  rectal sparing more proximal  colon.  The level of the cecum appeared normal, there were some small   cecal ulcers, markedly abnormal terminal ileum, status post biopsy of sigmoid colon and terminal ileum mucosa. Biopsy showed active colitis, nonspecific there   COLONOSCOPY   02/25/2003   MFM:Ipfpwlupcz polyps in the rectum (destroyed with the tip of the snare unit) not described above.  Otherwise normal rectum/. Left-sided diverticula/Sessile polyps as described above in the sigmoid colon at 25 and 35 sm.. polyps benign/inflammatory   COLONOSCOPY WITH PROPOFOL  N/A 02/16/2022   Procedure: COLONOSCOPY WITH PROPOFOL ;  Surgeon: Jeff Lamar HERO, MD;  Location: [facility] ENDO SUITE;  Service: Endoscopy;  Laterality: N/A;  10:00am   myocardial perfusion  02/14/10   POLYPECTOMY  02/16/2022   Procedure: POLYPECTOMY;  Surgeon: Jeff Lamar HERO, MD;  Location: [facility] ENDO SUITE;  Service: Endoscopy;;   PROSTATE SURGERY  2004   TRANSTHORACIC ECHOCARDIOGRAM      Current Outpatient Medications  Medication Sig Dispense Refill   Acetaminophen 500 MG capsule Take 500 mg by mouth at bedtime as needed (sleep).     amLODipine-atorvastatin (CADUET) 5-10 MG tablet Take 1 tablet by mouth daily.     aspirin  EC 325 MG tablet Take 325 mg by mouth every morning.      cholecalciferol (VITAMIN D3) 25 MCG (1000 UT) tablet Take 1,000 Units by mouth daily.     losartan (COZAAR) 100 MG tablet Take 100 mg by mouth daily.  metoprolol succinate (TOPROL-XL) 25 MG 24 hr tablet Take 25 mg by mouth daily.     montelukast (SINGULAIR) 10 MG tablet Take 10 mg by mouth at bedtime.     simvastatin (ZOCOR) 40 MG tablet Take 40 mg by mouth every evening.     b complex-C-folic acid  1 MG capsule Take 1 capsule by mouth daily. (Patient not taking: Reported on 03/26/2024)     polyethylene glycol-electrolytes (NULYTELY) 420 g solution As directed (Patient not taking: Reported on 03/26/2024) 4000 mL 0   No current facility-administered medications for  this visit.   Allergies:  Patient has no known allergies.   Social History: The patient  reports that he quit smoking about 14 years ago. His smoking use included cigarettes. He started smoking about 29 years ago. He has a 3.8 pack-year smoking history. He has quit using smokeless tobacco. He reports that he does not currently use alcohol. He reports that he does not use drugs.   Family History: The patient's family history includes CAD in his brother, mother, and sister; Cancer in his father and paternal grandfather; Heart disease in his brother; Stroke in his maternal grandmother, mother, and sister.   ROS:  Please see the history of present illness. Otherwise, complete review of systems is positive for none.  All other systems are reviewed and negative.   Physical Exam: VS:  BP (!) 170/80 (BP Location: Left Arm, Cuff Size: Normal) Comment: has not taken morning meds  Pulse 87   Wt 196 lb 9.6 oz (89.2 kg)   SpO2 96%   BMI 27.42 kg/m , BMI Body mass index is 27.42 kg/m.  Wt Readings from Last 3 Encounters:  03/26/24 196 lb 9.6 oz (89.2 kg)  03/19/24 200 lb (90.7 kg)  02/14/22 198 lb 3.1 oz (89.9 kg)    General: Patient appears comfortable at rest. HEENT: Conjunctiva and lids normal, oropharynx clear with moist mucosa. Neck: Supple, no elevated JVP or carotid bruits, no thyromegaly. Lungs: Clear to auscultation, nonlabored breathing at rest. Cardiac: Regular rate and rhythm, systolic murmur present Abdomen: Soft, nontender, no hepatomegaly, bowel sounds present, no guarding or rebound. Extremities: No pitting edema, distal pulses 2+. Skin: Warm and dry. Musculoskeletal: No kyphosis. Neuropsychiatric: Alert and oriented x3, affect grossly appropriate.  Recent Labwork: 03/19/2024: BUN 13; Creatinine, Ser 0.91; Hemoglobin 12.4; Platelets 217; Potassium 3.7; Sodium 137     Component Value Date/Time   CHOL  12/10/2009 0630    143        ATP III CLASSIFICATION:  <200     mg/dL    Desirable  799-760  mg/dL   Borderline High  >=759    mg/dL   High          TRIG 62 12/10/2009 0630   HDL 24 (L) 12/10/2009 0630   CHOLHDL 6.0 12/10/2009 0630   VLDL 12 12/10/2009 0630   LDLCALC (H) 12/10/2009 0630    107        Total Cholesterol/HDL:CHD Risk Coronary Heart Disease Risk Table                     Men   Women  1/2 Average Risk   3.4   3.3  Average Risk       5.0   4.4  2 X Average Risk   9.6   7.1  3 X Average Risk  23.4   11.0        Use the calculated Patient Ratio above and  the CHD Risk Table to determine the patient's CHD Risk.        ATP III CLASSIFICATION (LDL):  <100     mg/dL   Optimal  899-870  mg/dL   Near or Above                    Optimal  130-159  mg/dL   Borderline  839-810  mg/dL   High  >809     mg/dL   Very High     Assessment and Plan:  Chest tightness - Occurs daily in the morning and not with exertion.  Resolves after drinking a glass of water. - ER visit couple of weeks ago, EKG and troponins within normal limits. - Likely secondary to undiagnosed GERD. - Start omeprazole  20 mg daily. - Will see him back in 6 months and reevaluate his chest tightness symptoms.  If he continues to have chest tightness despite being on omeprazole , will recommend stress testing.  He verbalized understanding of the plan.  HTN, poorly controlled - Did not take medications this morning. - Home blood pressures range around 120/130 mmHg SBP according to the patient. - Continue amlodipine 5 mg once daily and losartan 100 mg once daily.  Systolic murmur/MR - systolic murmur on examination, echocardiogram from 2016 showed normal LVEF, mild MR and aortic valve sclerosis without evidence of aortic valve stenosis.  Will repeat echocardiogram.  History of stroke s/p R CEA in 2011 - Repeat carotid artery Doppler ultrasound bilateral in 2017 showed less than 50% stenosis bilaterally. - Takes aspirin  325 mg once daily, switch to aspirin  81 mg once daily. -  Continue simvastatin 40 mg nightly.  HLD, unknown values - Continue simvastatin 40 mg nightly. - Goal LDL less than 70.  40 minutes spent in reviewing the prior records, ER notes, more than 3 labs, discussion of chest tightness, HTN, carotid artery stenosis with the patient and documentation.   Medication Adjustments/Labs and Tests Ordered: Current medicines are reviewed at length with the patient today.  Concerns regarding medicines are outlined above.    Disposition:  Follow up 6 months  Signed, Lenda Baratta Arleta Maywood, MD, 03/26/2024 10:43 AM    [facility] [address] [place], [place] [address]

## 2024-04-10 DIAGNOSIS — E7849 Other hyperlipidemia: Secondary | ICD-10-CM | POA: Diagnosis not present

## 2024-04-10 DIAGNOSIS — Z0001 Encounter for general adult medical examination with abnormal findings: Secondary | ICD-10-CM | POA: Diagnosis not present

## 2024-04-10 DIAGNOSIS — E119 Type 2 diabetes mellitus without complications: Secondary | ICD-10-CM | POA: Diagnosis not present

## 2024-04-10 DIAGNOSIS — C61 Malignant neoplasm of prostate: Secondary | ICD-10-CM | POA: Diagnosis not present

## 2024-04-10 DIAGNOSIS — I10 Essential (primary) hypertension: Secondary | ICD-10-CM | POA: Diagnosis not present

## 2024-04-11 ENCOUNTER — Ambulatory Visit: Payer: Self-pay | Admitting: Internal Medicine

## 2024-04-11 ENCOUNTER — Ambulatory Visit (HOSPITAL_COMMUNITY)
Admission: RE | Admit: 2024-04-11 | Discharge: 2024-04-11 | Disposition: A | Discharge status: Home / Self Care | Source: Ambulatory Visit | Attending: Internal Medicine | Admitting: Internal Medicine

## 2024-04-11 DIAGNOSIS — I340 Nonrheumatic mitral (valve) insufficiency: Secondary | ICD-10-CM | POA: Insufficient documentation

## 2024-04-11 LAB — ECHOCARDIOGRAM COMPLETE
AR max vel: 1.5 cm2
AV Peak grad: 11.7 mmHg
Ao pk vel: 1.71 m/s
Area-P 1/2: 3.37 cm2
Calc EF: 59.7 %
S' Lateral: 2.7 cm
Single Plane A2C EF: 64.5 %
Single Plane A4C EF: 54.7 %
